# Patient Record
Sex: Male | Born: 1940 | Race: White | Hispanic: No | Smoking: Former smoker
Health system: Southern US, Academic
[De-identification: ages and names within clinical notes are randomized; demographics above are authoritative.]

## PROBLEM LIST (undated history)

## (undated) DIAGNOSIS — J918 Pleural effusion in other conditions classified elsewhere: Secondary | ICD-10-CM

## (undated) DIAGNOSIS — K769 Liver disease, unspecified: Secondary | ICD-10-CM

## (undated) DIAGNOSIS — Z125 Encounter for screening for malignant neoplasm of prostate: Secondary | ICD-10-CM

## (undated) HISTORY — DX: Pleural effusion in other conditions classified elsewhere: J91.8

## (undated) HISTORY — PX: HAND SURGERY: SHX662

## (undated) HISTORY — DX: Liver disease, unspecified: K76.9

## (undated) HISTORY — DX: Encounter for screening for malignant neoplasm of prostate: Z12.5

## (undated) NOTE — Telephone Encounter (Signed)
Formatting of this note might be different from the original.  Faxed over order and documents to scheduling at [facility]  [phone]   Electronically signed by Janyth Contes, RMA at 02/06/2023 10:06 AM EDT

---

## 1990-05-10 ENCOUNTER — Other Ambulatory Visit (HOSPITAL_COMMUNITY): Payer: Self-pay

## 2009-05-30 HISTORY — PX: HX BACK SURGERY: SHX140

## 2009-11-25 DIAGNOSIS — M5136 Other intervertebral disc degeneration, lumbar region: Secondary | ICD-10-CM | POA: Insufficient documentation

## 2009-12-23 DIAGNOSIS — R270 Ataxia, unspecified: Secondary | ICD-10-CM | POA: Insufficient documentation

## 2010-05-30 HISTORY — PX: COLONSCOPY BEDSIDE: WVUENDOPRO140

## 2011-06-16 DIAGNOSIS — M5126 Other intervertebral disc displacement, lumbar region: Secondary | ICD-10-CM | POA: Insufficient documentation

## 2017-02-21 IMAGING — CR XRAY SHOULDER COMPLETE RT
1 series · 2 of 2 positions shown · non-contrast
Comparison: None.

Exam:  XRAY SHOULDER COMPLETE RT 2V
INDICATION: Pain.

[Series 4: view not recorded · 0.17mm/px · 2 of 2 slices shown]
[im 1/2]
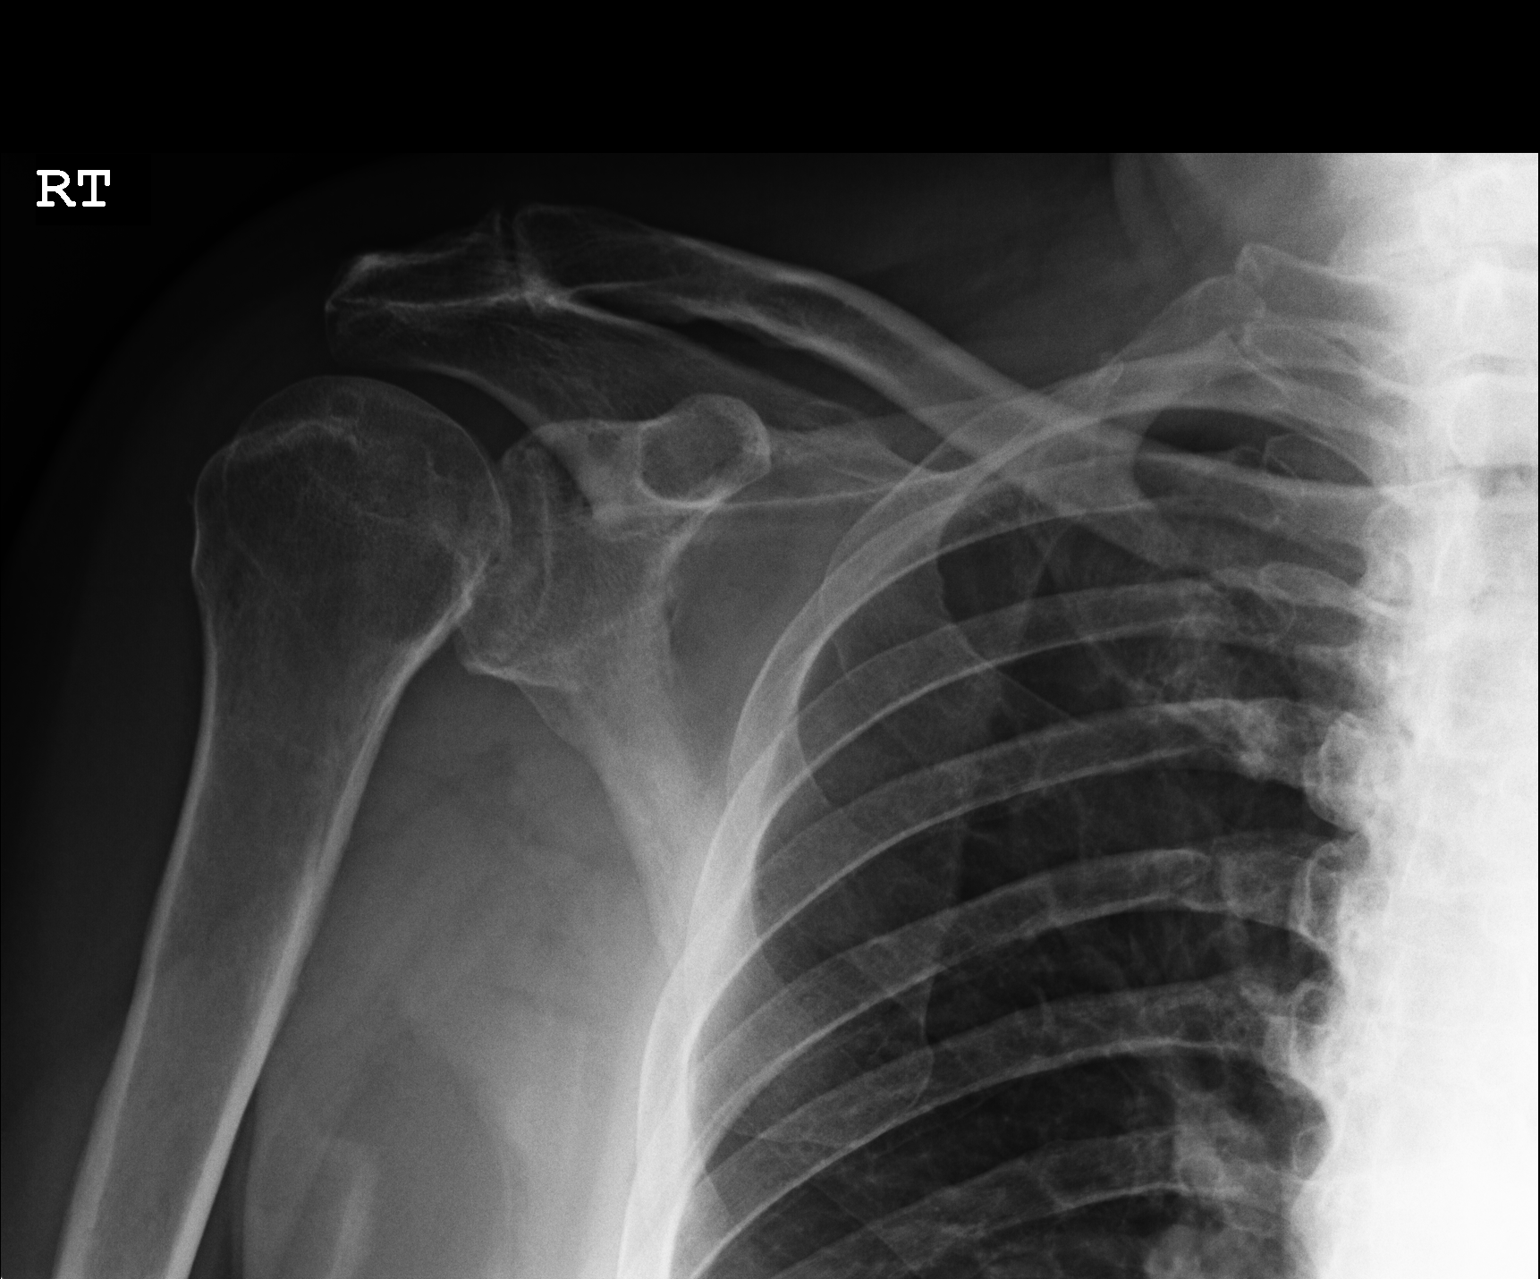
[im 2/2]
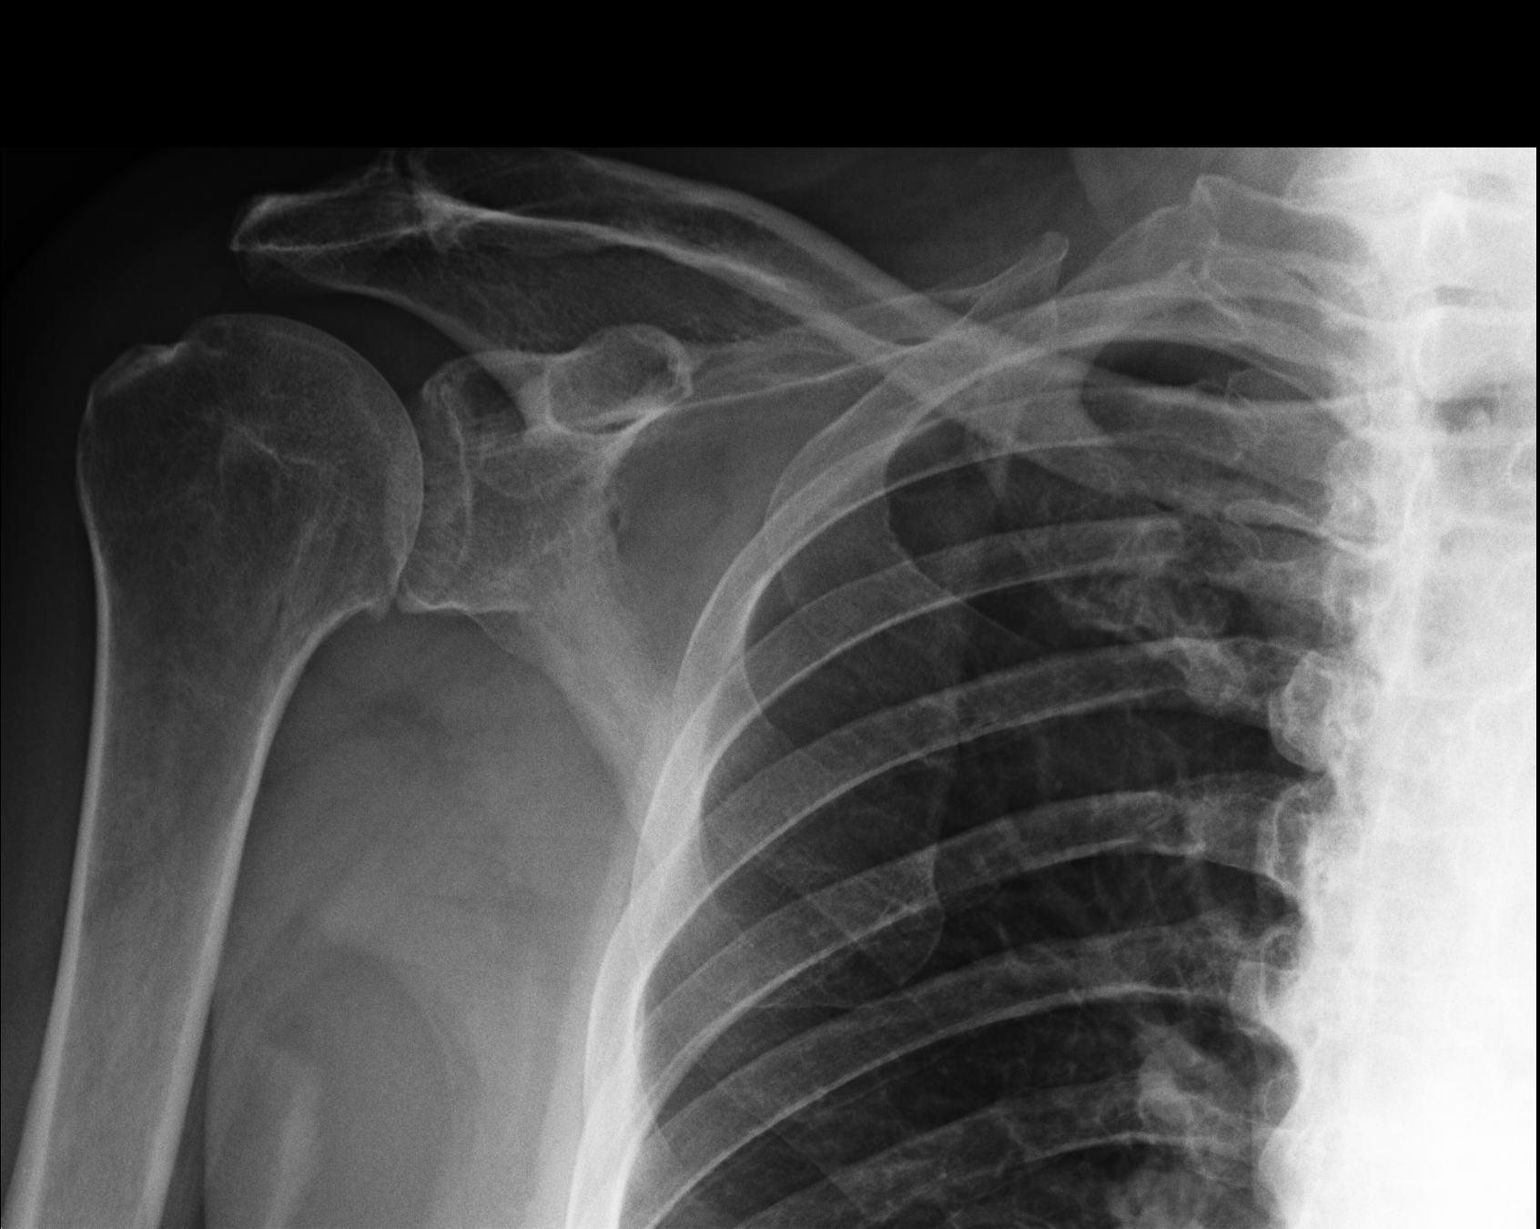

[2 of 2 positions shown; findings below may reference images not displayed]

FINDINGS: There is no acute fracture or subluxation. There is moderate osteoarthritis of the acromioclavicular and glenohumeral articulations. Visualized right lung is clear. Surrounding soft tissues are unremarkable.
IMPRESSION: Moderate osteoarthritis, no acute osseous abnormality.

## 2021-06-11 IMAGING — MR MRI SHOULDER LT W/O CONTRAST
6 of 7 series · 30 of 40 positions shown · IV contrast (gadolinium)
Comparison: None available.

﻿EXAM:  [id number]   MRI SHOULDER LT W/O CONTRAST
INDICATION: Pain for several months.
TECHNIQUE: Multiplanar multisequential MRI of the left shoulder joint was performed without gadolinium contrast.

[Series 6: T1 · oblique · left · 4.0mm · 0.31mm/px · 5 of 22 slices shown]
[im 1/22]
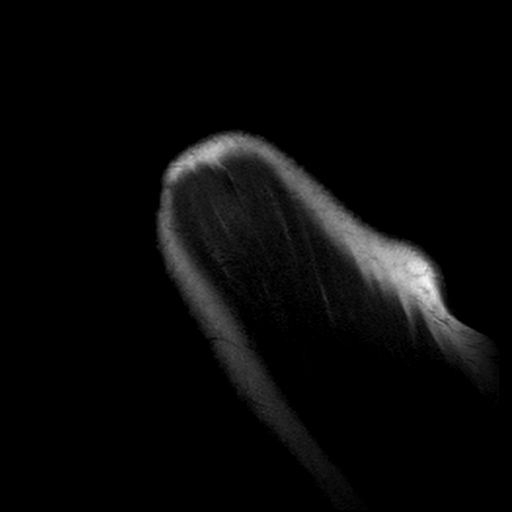
[im 6/22]
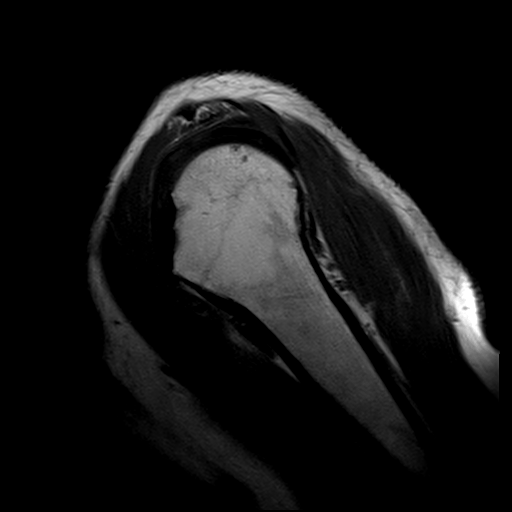
[im 11/22]
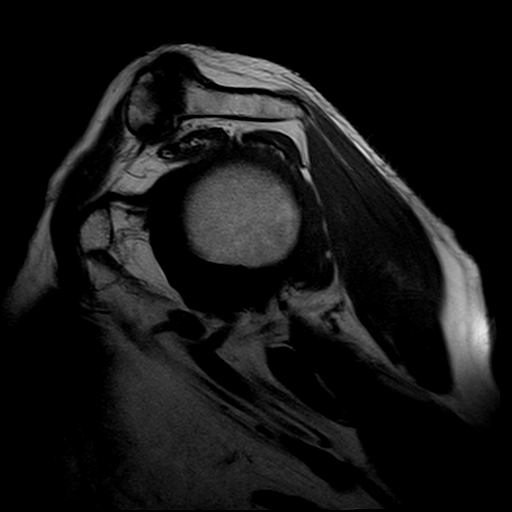
[im 16/22]
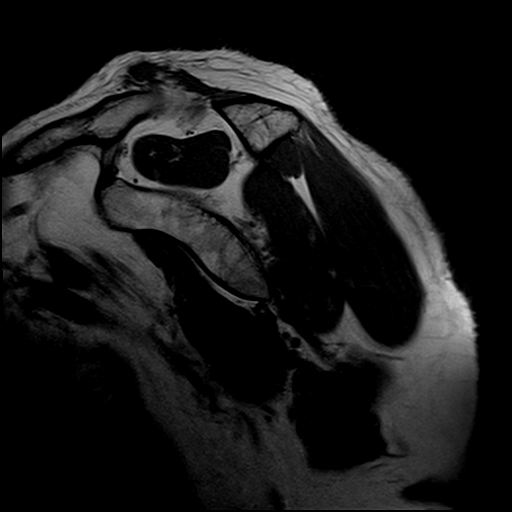
[im 22/22]
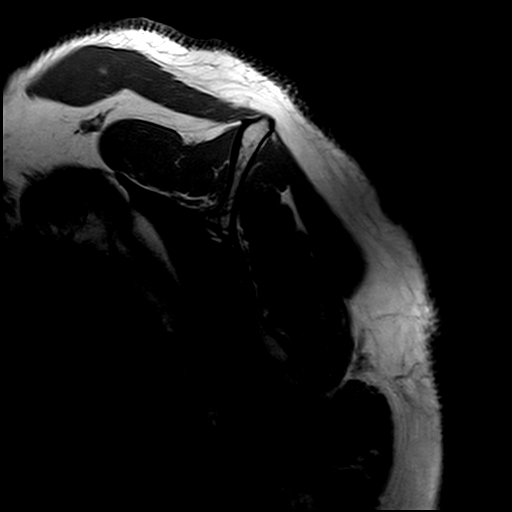

[Series 7: T2 · oblique · left · 4.0mm · 0.42mm/px · 5 of 22 slices shown]
[im 1/22]
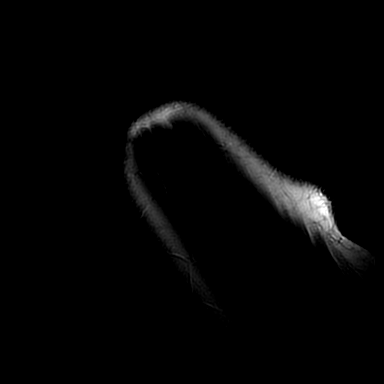
[im 6/22]
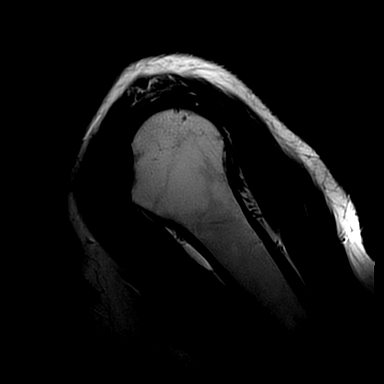
[im 11/22]
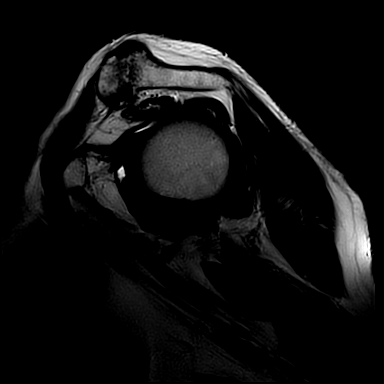
[im 16/22]
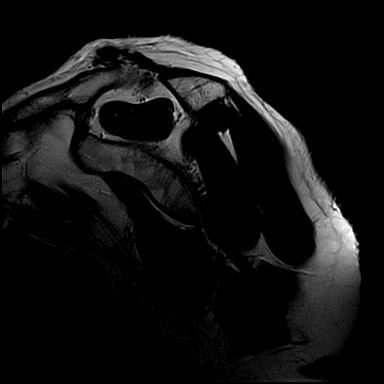
[im 22/22]
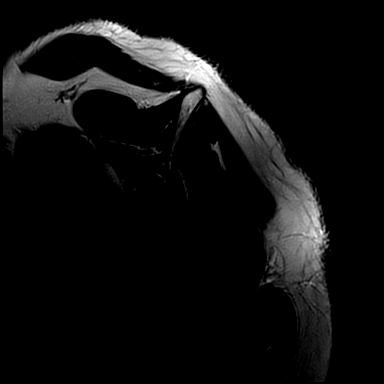

[Series 8: STIR · oblique · left · 4.0mm · 0.36mm/px · 2 of 22 slices shown]
[im 1/22]
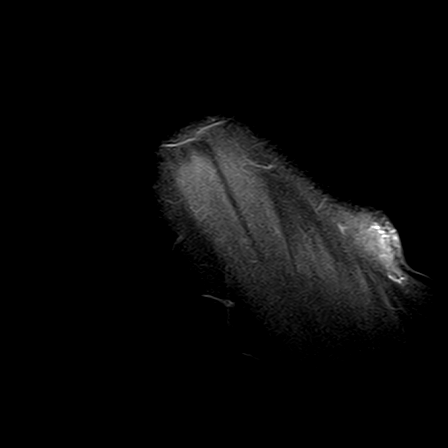
[im 5/22]
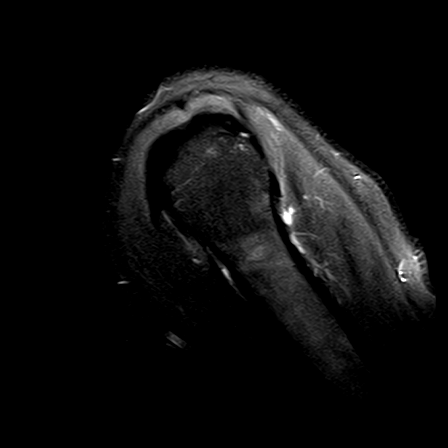

[Series 9: PD fat-sat · axial · left · 4.0mm · 0.36mm/px · z∈[-54,+55]mm · 6 of 24 slices shown (1 of 2)]
[im 1/24]
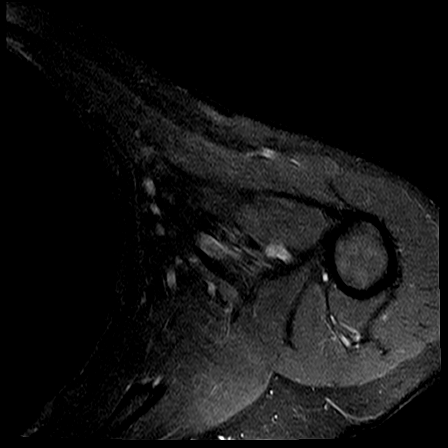
[im 5/24]
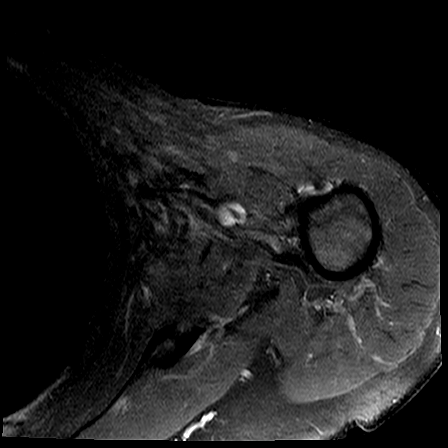
[im 10/24]
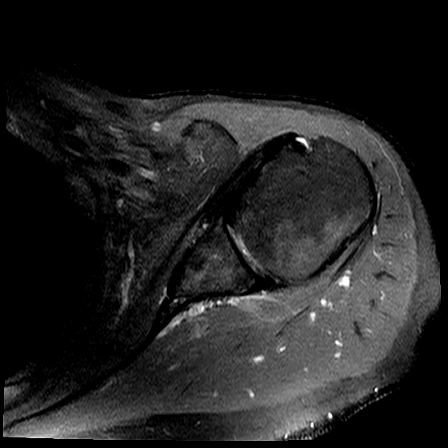
[im 14/24]
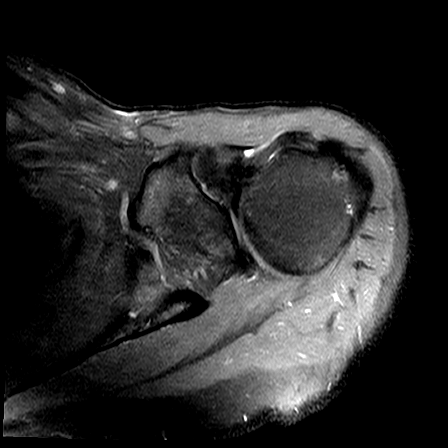
[im 19/24]
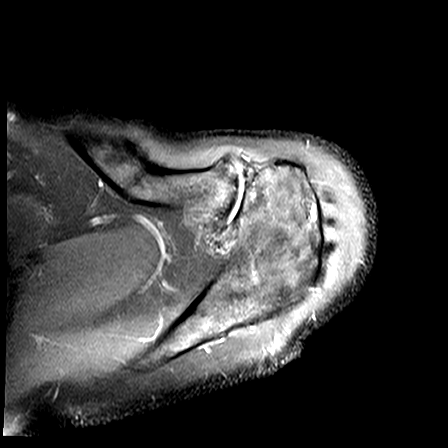
[im 24/24]
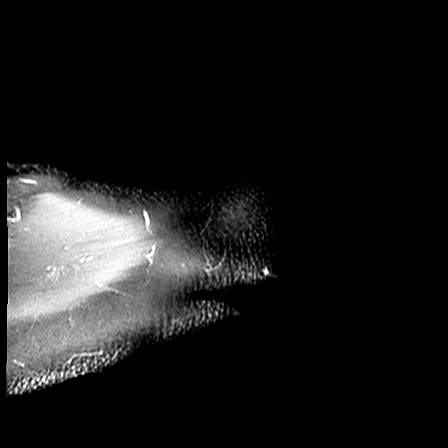

[Series 10: PD fat-sat · oblique · left · 4.0mm · 0.50mm/px · 6 of 23 slices shown (2 of 2)]
[im 1/23]
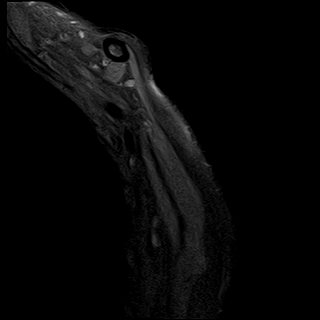
[im 5/23]
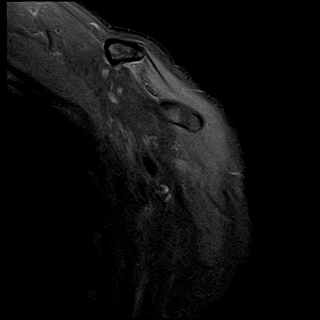
[im 9/23]
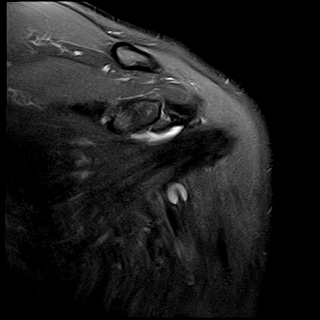
[im 14/23]
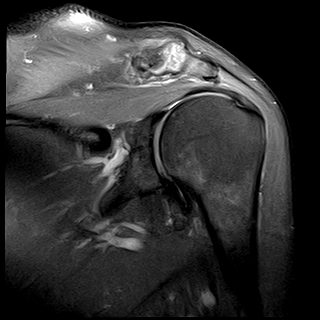
[im 18/23]
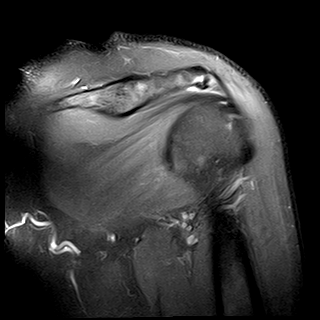
[im 23/23]
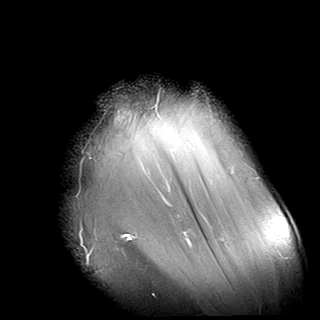

[Series 12: T2 fat-sat · axial · left · 4.0mm · 0.42mm/px · z∈[-54,+55]mm · 6 of 24 slices shown]
[im 1/24]
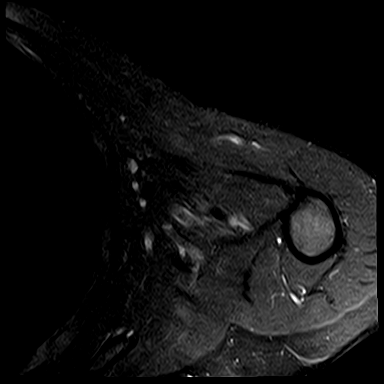
[im 5/24]
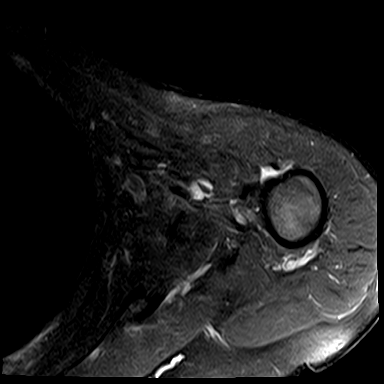
[im 10/24]
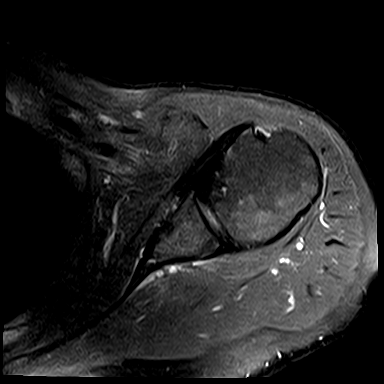
[im 14/24]
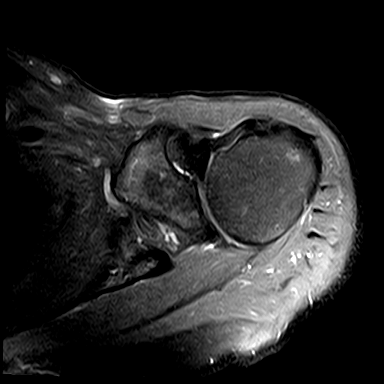
[im 19/24]
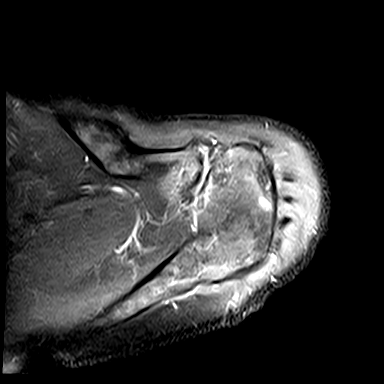
[im 24/24]
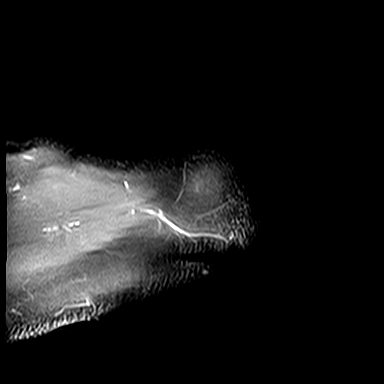

[30 of 40 positions shown; findings below may reference images not displayed]

FINDINGS: There is a partial undersurface infraspinatus tendon tear. Supraspinatus, teres minor and subscapularis muscles and tendons are within normal limits in morphology and signal intensity. Long head of biceps tendon is well seated within the intertubercular groove and attaches normally to the biceps anchor. Superior labrum is intact. Glenohumeral articular cartilage is well maintained. There is advanced acromioclavicular joint osteoarthritis. There is no significant fluid within the subacromial/subdeltoid bursa. Muscle bulk and bone marrow signal intensity are normal. No mass is seen along the course of the suprascapular nerve, within the spinoglenoid notch or within the quadrilateral space.
IMPRESSION: 1. Partial undersurface infraspinatus tendon tear. 

2. Advanced acromioclavicular joint osteoarthritis.

## 2021-07-12 IMAGING — CT CT THORAX  W/O CONTRAST
2 of 4 series · 15 of 36 positions shown, 18 images · non-contrast
Comparison: Chest radiograph dated 01/15/2018.

﻿EXAM:  CT THORAX  W/O CONTRAST
INDICATION: Shortness of breath for 3 months.
TECHNIQUE: Helical noncontrast CT imaging of the chest was performed. Images were reviewed in multiple windows and projections. Exam was performed using 1 or more of the following dose reduction techniques: Automated exposure control, adjustment of the mA and/or kV according to patient size, or the use of iterative reconstruction technique.

[cor · coronal · 0.86mm/px · 3 of 72 slices shown]
[im 15/72  lung]
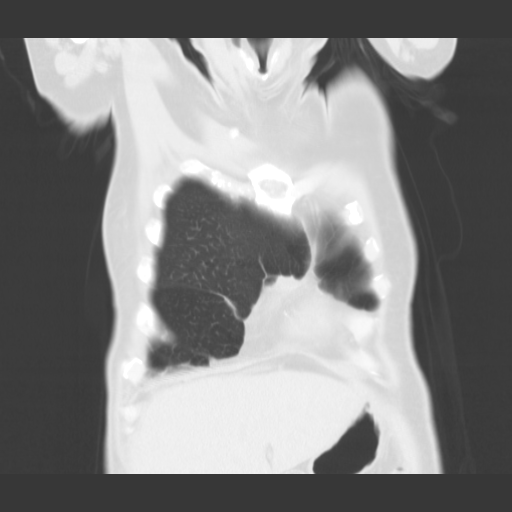
[im 29/72  lung]
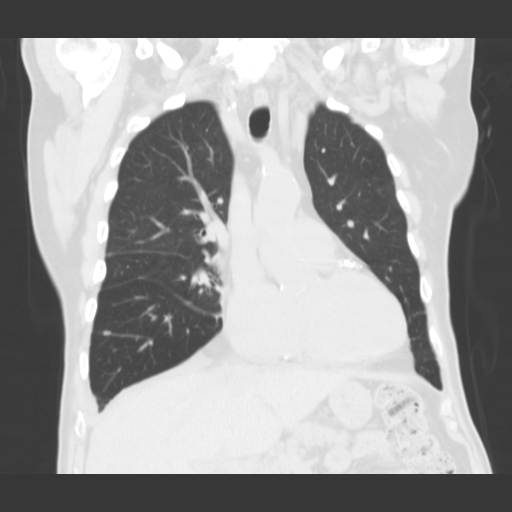
[im 43/72  lung]
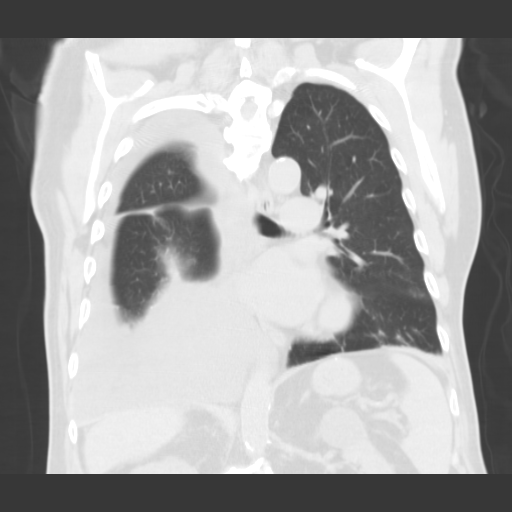

[lung · axial · 0.86mm/px · z∈[-267,+59]mm · 12 of 187 slices shown, 15 images]
[im 12/187  mediastinal]
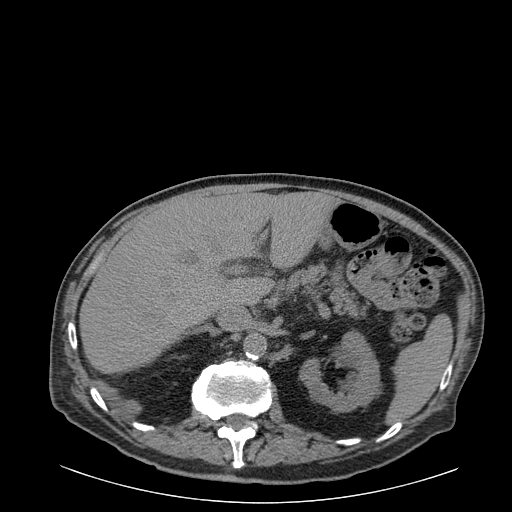
[im 12/187  lung]
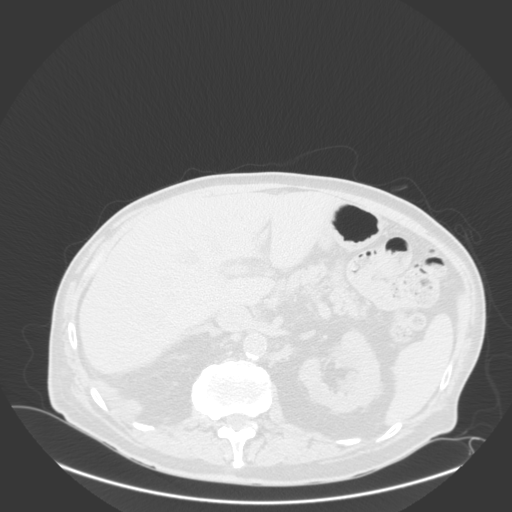
[im 24/187  lung]
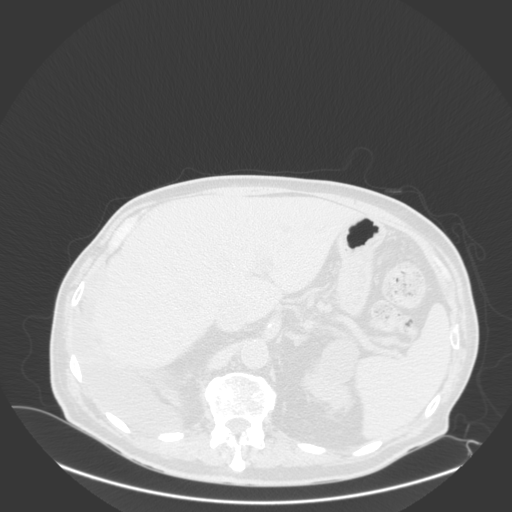
[im 47/187  lung]
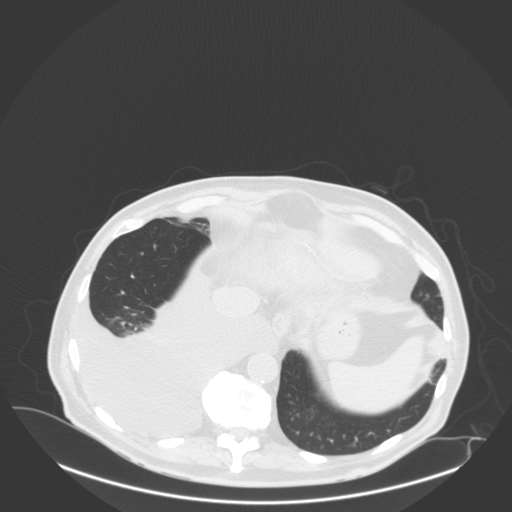
[im 59/187  lung]
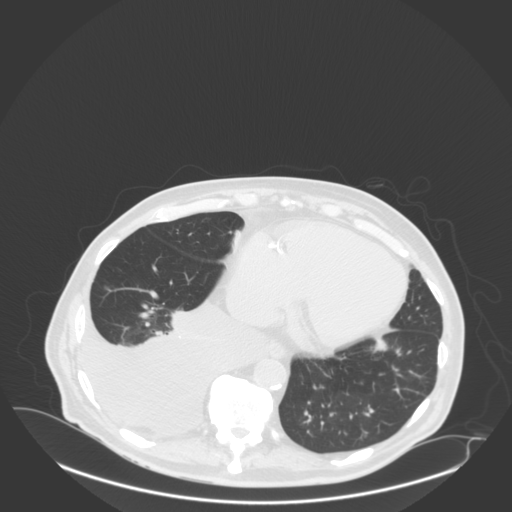
[im 70/187  mediastinal]
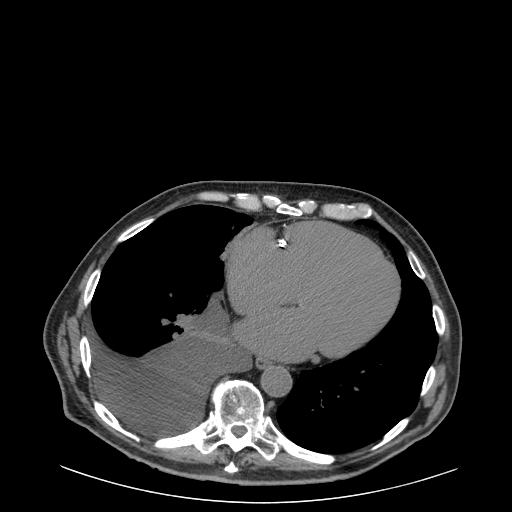
[im 70/187  lung]
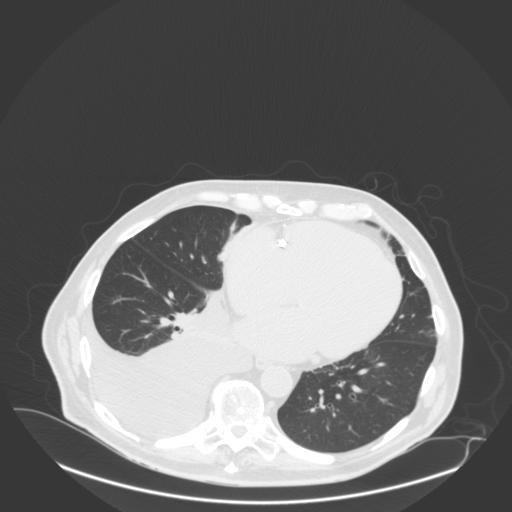
[im 82/187  lung]
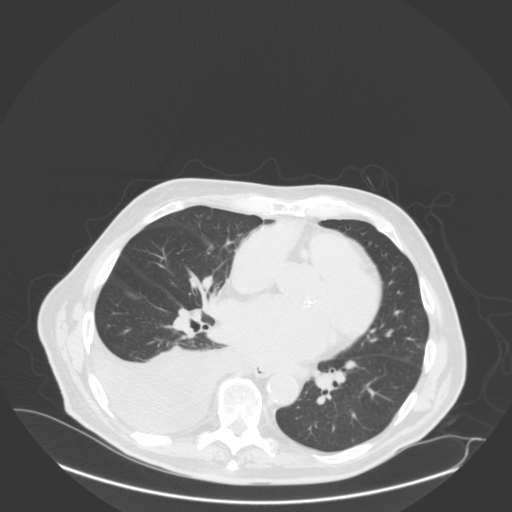
[im 105/187  lung]
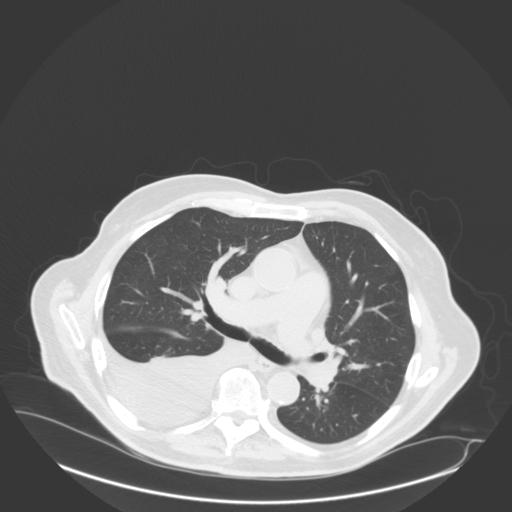
[im 117/187  lung]
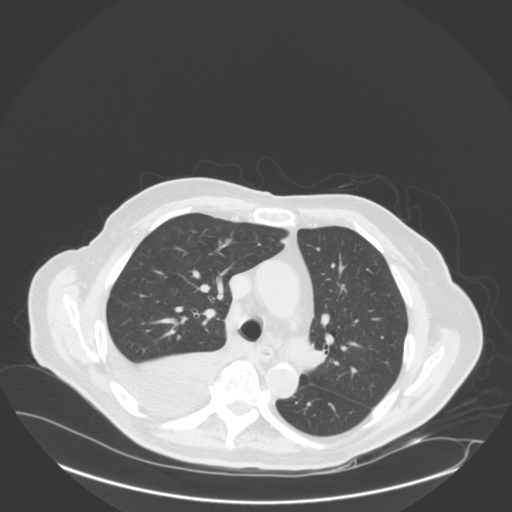
[im 128/187  mediastinal]
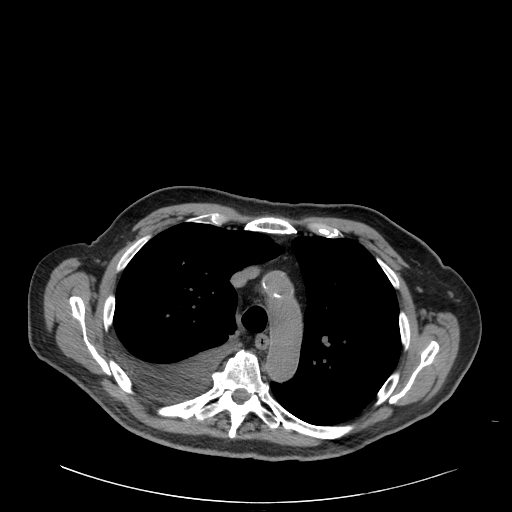
[im 128/187  lung]
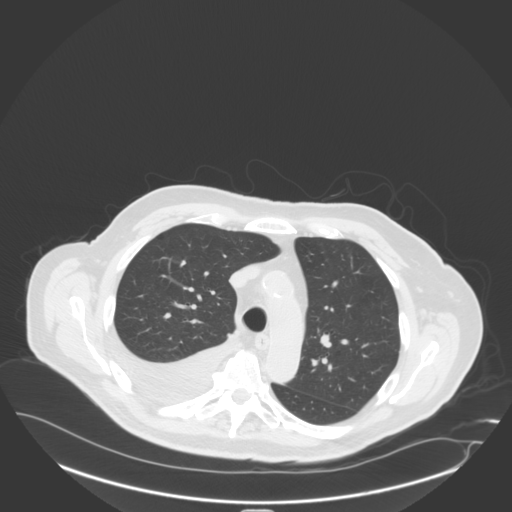
[im 140/187  lung]
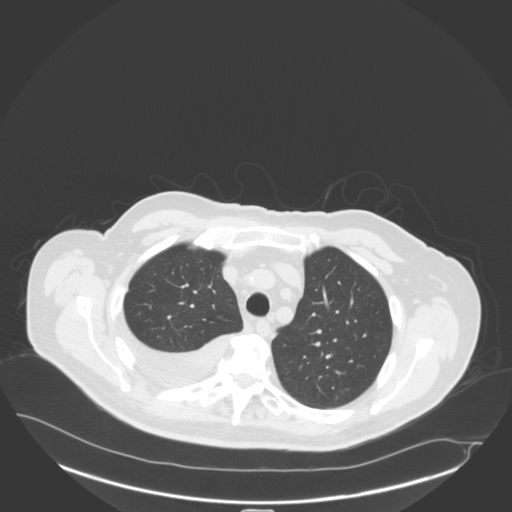
[im 163/187  lung]
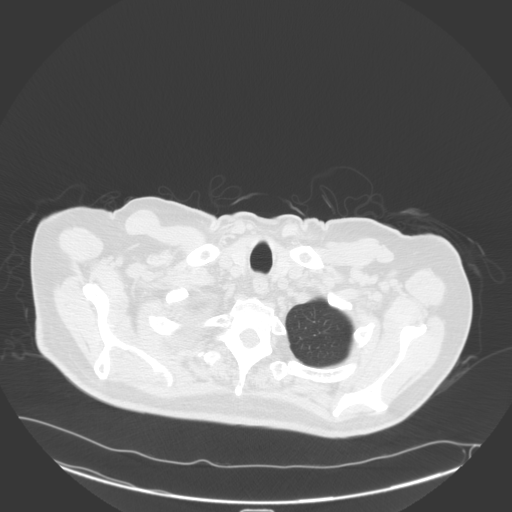
[im 175/187  lung]
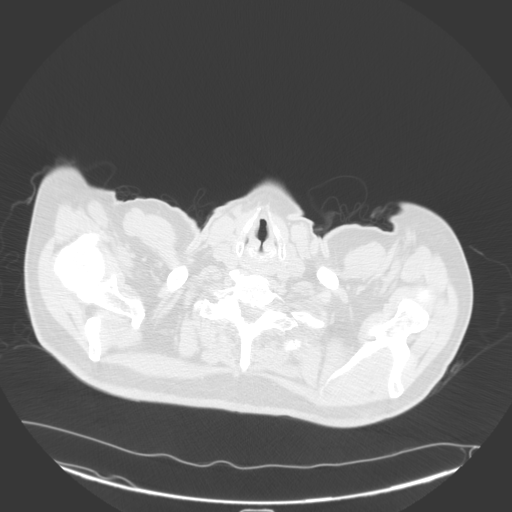

[15 of 36 positions shown; findings below may reference images not displayed]

FINDINGS: There is suggestion of a 12 mm inferior left thyroid lobe nodule. Trachea and mainstem bronchi are patent.  There is no mediastinal or axillary adenopathy. Evaluation for hilar adenopathy is limited due to the lack of intravenous contrast.  There is a moderate right pleural effusion. There are also a few noncalcified lung nodules measuring up to 7.3 mm within inferior right lower lobe.  There is no pericardial effusion.  There is no aortic aneurysm. Coronary arteries are heavily calcified.  Left renal cysts are incompletely seen.  Visualized osseous structures appear unremarkable.
IMPRESSION: 1. Moderate right pleural effusion. 

2. Subcentimeter noncalcified lung nodules as detailed above. Follow-up chest CT scan is recommended in 6 months for reassessment.  

3. Suggestion of a 12 mm inferior left thyroid lobe nodule. Follow-up nonemergent thyroid sonogram is also recommended for better assessment.

## 2021-08-02 ENCOUNTER — Other Ambulatory Visit (RURAL_HEALTH_CENTER): Payer: Self-pay | Admitting: Family

## 2021-08-02 DIAGNOSIS — E559 Vitamin D deficiency, unspecified: Secondary | ICD-10-CM

## 2021-08-04 ENCOUNTER — Encounter (RURAL_HEALTH_CENTER): Payer: Self-pay | Admitting: Family

## 2021-08-04 DIAGNOSIS — I509 Heart failure, unspecified: Secondary | ICD-10-CM | POA: Insufficient documentation

## 2021-08-04 DIAGNOSIS — I4891 Unspecified atrial fibrillation: Secondary | ICD-10-CM | POA: Insufficient documentation

## 2021-08-04 DIAGNOSIS — G4730 Sleep apnea, unspecified: Secondary | ICD-10-CM | POA: Insufficient documentation

## 2021-08-04 DIAGNOSIS — J90 Pleural effusion, not elsewhere classified: Secondary | ICD-10-CM | POA: Insufficient documentation

## 2021-08-04 DIAGNOSIS — K769 Liver disease, unspecified: Secondary | ICD-10-CM | POA: Insufficient documentation

## 2021-08-04 DIAGNOSIS — I350 Nonrheumatic aortic (valve) stenosis: Secondary | ICD-10-CM | POA: Insufficient documentation

## 2021-08-06 ENCOUNTER — Telehealth (RURAL_HEALTH_CENTER): Payer: Self-pay | Admitting: Family

## 2021-08-06 NOTE — Telephone Encounter (Signed)
Patient came in the office and needs his medications called into the pharmacy,  He needs the following prescriptions :  Ampicillian, Fluticassone-Furoate-Vilonterol 100-25  Please send these to [facility] Thank You  Cherlyn Labella 08/06/2021

## 2021-08-09 ENCOUNTER — Other Ambulatory Visit (RURAL_HEALTH_CENTER): Payer: Self-pay | Admitting: Family

## 2021-08-09 ENCOUNTER — Other Ambulatory Visit (HOSPITAL_COMMUNITY): Payer: Self-pay | Admitting: PULMONARY DISEASE

## 2021-08-09 DIAGNOSIS — J90 Pleural effusion, not elsewhere classified: Secondary | ICD-10-CM

## 2021-08-09 MED ORDER — FLUTICASONE FUROATE 100 MCG-VILANTEROL 25 MCG/DOSE INHALATION POWDER
1.0000 | DISK | Freq: Every day | RESPIRATORY_TRACT | 3 refills | Status: DC
Start: 2021-08-09 — End: 2021-08-16

## 2021-08-09 MED ORDER — AMPICILLIN 500 MG CAPSULE
500.0000 mg | ORAL_CAPSULE | Freq: Every day | ORAL | 1 refills | Status: DC
Start: 2021-08-09 — End: 2021-08-19

## 2021-08-16 ENCOUNTER — Other Ambulatory Visit (RURAL_HEALTH_CENTER): Payer: Self-pay | Admitting: Family

## 2021-08-16 MED ORDER — FLUTICASONE FUROATE 100 MCG-VILANTEROL 25 MCG/DOSE INHALATION POWDER
1.0000 | DISK | Freq: Every day | RESPIRATORY_TRACT | 3 refills | Status: DC
Start: 2021-08-16 — End: 2021-08-19

## 2021-08-18 ENCOUNTER — Inpatient Hospital Stay (HOSPITAL_BASED_OUTPATIENT_CLINIC_OR_DEPARTMENT_OTHER)
Admission: RE | Admit: 2021-08-18 | Discharge: 2021-08-18 | Disposition: A | Discharge status: Home / Self Care | Payer: Medicare Other | Source: Ambulatory Visit

## 2021-08-18 ENCOUNTER — Inpatient Hospital Stay
Admission: RE | Admit: 2021-08-18 | Discharge: 2021-08-18 | Disposition: A | Discharge status: Home / Self Care | Payer: Medicare Other | Source: Ambulatory Visit | Attending: PULMONARY DISEASE | Admitting: PULMONARY DISEASE

## 2021-08-18 ENCOUNTER — Other Ambulatory Visit: Payer: Self-pay

## 2021-08-18 DIAGNOSIS — J90 Pleural effusion, not elsewhere classified: Secondary | ICD-10-CM

## 2021-08-18 DIAGNOSIS — J69 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: Secondary | ICD-10-CM

## 2021-08-18 HISTORY — DX: 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: J69.0

## 2021-08-18 LAB — BODY FLUID CELL COUNT WITH DIFFERENTIAL
NUCLEATED CELLS, FLUID: 733 /uL
RBC COUNT: 0 /uL

## 2021-08-18 LAB — BODY FLUID SEROUS MAN DIFF
LYMPHOCYTE %: 71 %
NEUTROPHIL %: 8 %
OTHER CELL %: 21 %

## 2021-08-19 ENCOUNTER — Ambulatory Visit (RURAL_HEALTH_CENTER): Payer: Medicare Other | Attending: Family | Admitting: Family

## 2021-08-19 ENCOUNTER — Other Ambulatory Visit: Payer: Medicare Other | Attending: Family | Admitting: Family

## 2021-08-19 ENCOUNTER — Encounter (RURAL_HEALTH_CENTER): Payer: Self-pay | Admitting: Family

## 2021-08-19 ENCOUNTER — Ambulatory Visit (RURAL_HEALTH_CENTER): Payer: Self-pay | Admitting: Family

## 2021-08-19 VITALS — BP 122/86 | HR 64 | Temp 98.0°F | Resp 18 | Ht 70.0 in | Wt 174.0 lb

## 2021-08-19 DIAGNOSIS — L739 Follicular disorder, unspecified: Secondary | ICD-10-CM | POA: Insufficient documentation

## 2021-08-19 DIAGNOSIS — J4520 Mild intermittent asthma, uncomplicated: Secondary | ICD-10-CM | POA: Insufficient documentation

## 2021-08-19 DIAGNOSIS — E559 Vitamin D deficiency, unspecified: Secondary | ICD-10-CM | POA: Insufficient documentation

## 2021-08-19 DIAGNOSIS — Z7409 Other reduced mobility: Secondary | ICD-10-CM | POA: Insufficient documentation

## 2021-08-19 DIAGNOSIS — R269 Unspecified abnormalities of gait and mobility: Secondary | ICD-10-CM | POA: Insufficient documentation

## 2021-08-19 DIAGNOSIS — R7301 Impaired fasting glucose: Secondary | ICD-10-CM | POA: Insufficient documentation

## 2021-08-19 DIAGNOSIS — F419 Anxiety disorder, unspecified: Secondary | ICD-10-CM | POA: Insufficient documentation

## 2021-08-19 DIAGNOSIS — I10 Essential (primary) hypertension: Secondary | ICD-10-CM | POA: Insufficient documentation

## 2021-08-19 DIAGNOSIS — Z87891 Personal history of nicotine dependence: Secondary | ICD-10-CM | POA: Insufficient documentation

## 2021-08-19 DIAGNOSIS — E782 Mixed hyperlipidemia: Secondary | ICD-10-CM | POA: Insufficient documentation

## 2021-08-19 DIAGNOSIS — G4730 Sleep apnea, unspecified: Secondary | ICD-10-CM

## 2021-08-19 DIAGNOSIS — J449 Chronic obstructive pulmonary disease, unspecified: Secondary | ICD-10-CM | POA: Insufficient documentation

## 2021-08-19 HISTORY — DX: Follicular disorder, unspecified: L73.9

## 2021-08-19 LAB — MANUAL DIFFERENTIAL
EOSINOPHIL %: 3 %
EOSINOPHIL ABSOLUTE: 0.16 10*3/uL
EOSINOPHILS MANUAL: 3
LYMPHOCYTE %: 18 %
LYMPHOCYTE ABSOLUTE: 0.95 10*3/uL — ABNORMAL LOW (ref 1.10–5.00)
LYMPHOCYTES MANUAL: 18
MONOCYTE %: 9 %
MONOCYTE ABSOLUTE: 0.48 10*3/uL
MONOCYTES MANUAL: 9
NEUTROPHIL %: 70 %
NEUTROPHIL ABSOLUTE: 3.71 10*3/uL (ref 1.80–8.40)
NEUTROPHILS MANUAL: 70
PLATELET MORPHOLOGY COMMENT: DECREASED
RBC MORPHOLOGY COMMENT: NORMAL
TOTAL CELLS COUNTED [#] IN BLOOD: 100
WBC: 5.3 10*3/uL

## 2021-08-19 LAB — CBC
HCT: 42.4 % (ref 42.0–51.0)
HGB: 14.8 g/dL (ref 13.5–18.0)
MCH: 34 pg — ABNORMAL HIGH (ref 27.0–32.0)
MCHC: 34.9 g/dL (ref 32.0–36.0)
MCV: 97.5 fL (ref 78.0–99.0)
MPV: 8.2 fL (ref 7.4–10.4)
PLATELETS: 138 10*3/uL — ABNORMAL LOW (ref 140–440)
RBC: 4.35 10*6/uL (ref 4.20–6.00)
RDW: 13 % (ref 11.6–14.8)
WBC: 5.3 10*3/uL (ref 4.0–10.5)
WBCS UNCORRECTED: 5.6 10*3/uL

## 2021-08-19 LAB — CYTOPATHOLOGY, NON GYN

## 2021-08-19 LAB — COMPREHENSIVE METABOLIC PNL, FASTING
ALBUMIN/GLOBULIN RATIO: 1.4 (ref 0.8–1.4)
ALBUMIN: 4.6 g/dL (ref 3.5–5.7)
ALKALINE PHOSPHATASE: 81 U/L (ref 34–104)
ALT (SGPT): 24 U/L (ref 7–52)
ANION GAP: 5 mmol/L — ABNORMAL LOW (ref 10–20)
AST (SGOT): 33 U/L (ref 13–39)
BILIRUBIN TOTAL: 1.5 mg/dL — ABNORMAL HIGH (ref 0.3–1.2)
BUN/CREA RATIO: 24 — ABNORMAL HIGH (ref 6–22)
BUN: 20 mg/dL (ref 7–25)
CALCIUM, CORRECTED: 9.3 mg/dL (ref 8.9–10.8)
CALCIUM: 9.9 mg/dL (ref 8.6–10.3)
CHLORIDE: 107 mmol/L (ref 98–107)
CO2 TOTAL: 30 mmol/L (ref 21–31)
CREATININE: 0.82 mg/dL (ref 0.60–1.30)
ESTIMATED GFR: 89 mL/min/{1.73_m2} (ref >59)
GLOBULIN: 3.2 (ref 2.9–5.4)
GLUCOSE: 108 mg/dL (ref 74–109)
OSMOLALITY, CALCULATED: 286 mOsm/kg (ref 270–290)
POTASSIUM: 4.5 mmol/L (ref 3.5–5.1)
PROTEIN TOTAL: 7.8 g/dL (ref 6.4–8.9)
SODIUM: 142 mmol/L (ref 136–145)

## 2021-08-19 LAB — VITAMIN D 25 TOTAL: VITAMIN D: 68 ng/mL (ref 30–100)

## 2021-08-19 LAB — LIPID PANEL
CHOL/HDL RATIO: 2.1
CHOLESTEROL: 133 mg/dL — ABNORMAL LOW (ref 136–290)
HDL CHOL: 63 mg/dL (ref 23–92)
LDL CALC: 61 mg/dL (ref 0–100)
TRIGLYCERIDES: 44 mg/dL (ref <=150)
VLDL CALC: 9 mg/dL (ref 0–50)

## 2021-08-19 LAB — ALBUMIN, BODY FLUID: ALBUMIN BODY FLUID: 2.7 g/dL

## 2021-08-19 LAB — MAGNESIUM: MAGNESIUM: 2.3 mg/dL (ref 1.9–2.7)

## 2021-08-19 LAB — VITAMIN B12: VITAMIN B 12: 709 pg/mL (ref 180–914)

## 2021-08-19 LAB — GLUCOSE BODY FLUID: GLUCOSE BODY FLUID: 113 mg/dL (ref 65–125)

## 2021-08-19 LAB — LDH, BODY FLUID: LDH BODY FLUID: 82 U/L

## 2021-08-19 MED ORDER — CHOLECALCIFEROL (VITAMIN D3) 1,250 MCG (50,000 UNIT) CAPSULE
50000.0000 [IU] | ORAL_CAPSULE | ORAL | 1 refills | Status: DC
Start: 2021-08-19 — End: 2022-03-30

## 2021-08-19 MED ORDER — LOSARTAN 100 MG-HYDROCHLOROTHIAZIDE 25 MG TABLET
1.0000 | ORAL_TABLET | Freq: Every day | ORAL | 1 refills | Status: DC
Start: 2021-08-19 — End: 2022-03-30

## 2021-08-19 MED ORDER — FLUTICASONE FUROATE 100 MCG-VILANTEROL 25 MCG/DOSE INHALATION POWDER
1.0000 | DISK | Freq: Every day | RESPIRATORY_TRACT | 3 refills | Status: AC
Start: 2021-08-19 — End: 2021-11-17

## 2021-08-19 MED ORDER — ROSUVASTATIN 20 MG TABLET
20.0000 mg | ORAL_TABLET | Freq: Every day | ORAL | 1 refills | Status: DC
Start: 2021-08-19 — End: 2022-06-30

## 2021-08-19 MED ORDER — ALBUTEROL SULFATE HFA 90 MCG/ACTUATION AEROSOL INHALER
2.0000 | INHALATION_SPRAY | Freq: Four times a day (QID) | RESPIRATORY_TRACT | 1 refills | Status: AC | PRN
Start: 2021-08-19 — End: ?

## 2021-08-19 MED ORDER — AMPICILLIN 500 MG CAPSULE
500.0000 mg | ORAL_CAPSULE | Freq: Every day | ORAL | 1 refills | Status: DC
Start: 2021-08-19 — End: 2022-04-01

## 2021-08-19 NOTE — Nursing Note (Signed)
Patient is here for 3 month follow up with medication refills and no new concerns at this time.

## 2021-08-19 NOTE — Progress Notes (Signed)
FAMILY MEDICINE, [facility]  [address]  [place] [address]  Operated by [facility]     Name: Jesse Cooper. MRN:  [id number]   Date of Birth: 1940-12-06 Age: 81 y.o.   Date: 08/19/2021  Time: 10:58     Provider: Stormy Fabian, NP-C    Reason for visit: Follow Up 3 Months      History of Present Illness:  Jesse Cooper. is a 81 y.o. male presenting with chronic disease management..  Managed by cardiology and pulmonology.    Patient Active Problem List    Diagnosis Date Noted   . Essential hypertension 08/19/2021   . Mixed hyperlipidemia 08/19/2021   . Anxiety 08/19/2021   . 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 Mild intermittent asthma 08/19/2021   . Hypomagnesemia 08/19/2021   . IFG (impaired fasting glucose) 08/19/2021   . Impaired functional mobility, balance, gait, and endurance 08/19/2021   . Vitamin D deficiency, unspecified 08/19/2021   . Sleep apnea 08/04/2021     Dr. Felton Clinton, Pulmonologist. Compliant with CPAP.     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 Felton Clinton, Pulmonologist.     . Unspecified atrial fibrillation (CMS HCC) 08/04/2021   . 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 Congestive heart failure (CHF) (CMS HCC) 08/04/2021       Historical Data    Past Medical History:  History reviewed. No pertinent past medical history.  Past Surgical History:  Past Surgical History:   Procedure Laterality Date   . COLONSCOPY BEDSIDE N/A 2012    [place] - no polyps   . HAND SURGERY Left     [facility] Kill[facility]   . [facility] BACK SURGERY N/A 2011    2013 [place], [place]     Allergies:  Allergies   Allergen Reactions   . Hydrocodone-Homatropine Itching     Medications:  Current Outpatient Medications   Medication Sig   . albuterol sulfate (PROVENTIL OR VENTOLIN OR PROAIR) 90 mcg/actuation Inhalation oral inhaler Take 2 Puffs by inhalation Every 6 hours as needed    . ampicillin (PRINCIPEN) 500 mg Oral Capsule Take 1 Capsule (500 mg total) by mouth Once a day for 90 days Indications: folliculitis   . carvediloL (COREG) 25 mg Oral Tablet Take 1 Tablet (25 mg total) by mouth Twice daily   . cholecalciferol, vitamin D3, 1,250 mcg (50,000 unit) Oral Capsule Take 1 Capsule (50,000 Units total) by mouth Every 7 days   . diltiazem HCl (DILT-XR) 240 mg Oral Capsule,Degradable Cnt Release Take 1 Capsule (240 mg total) by mouth Once a day   . ELIQUIS 5 mg Oral Tablet Take 1 Tablet (5 mg total) by mouth Twice daily   . fluticasone furoate-vilanteroL (BREO ELLIPTA) 100-25 mcg/dose Inhalation Disk with Device Take 1 INHALATION by inhalation Once a day for 90 days   . losartan-hydrochlorothiazide (HYZAAR) 100-25 mg Oral Tablet Take 1 Tablet by mouth Once a day   . Magnesium 250 mg Oral Tablet Take 1 Tablet (250 mg total) by mouth Once a day   . multivitamin (SUPER THERA VITE M) Oral Tablet Take 1 Tablet by mouth   . rosuvastatin (CRESTOR) 20 mg Oral Tablet Take 1 Tablet (20 mg total) by mouth Once a day   . traMADoL (ULTRAM) 50 mg Oral Tablet Take 1 Tablet (50 mg  total) by mouth Every 8 hours as needed     Family History:  Family Medical History:     Problem Relation (Age of Onset)    Dementia Mother    Elevated Lipids Brother    Lung Cancer Father          Social History:  Social History     Socioeconomic History   . Marital status: Widowed   . Number of children: 3   Tobacco Use   . Smoking status: Former     Packs/day: 1.00     Years: 12.00     Pack years: 12.00     Types: Cigarettes     Start date: 1962     Quit date: 1975     Years since quitting: 48.2   . Smokeless tobacco: Never   . Tobacco comments:     Quit chewing tobacco in 1988   Substance and Sexual Activity   . Alcohol use: Not Currently     Alcohol/week: 42.0 standard drinks     Types: 7 Cans of beer, 14 Shots of liquor, 21 Standard drinks or equivalent per week   . Drug use: Never           Review of Systems:  Any pertinent  Review of Systems as addressed in the HPI above.    Physical Exam:  Vital Signs:  Vitals:    08/19/21 1020   BP: 122/86   Pulse: 64   Resp: 18   Temp: 36.7 C (98 F)   SpO2: 97%   Weight: 78.9 kg (174 lb)   Height: 1.778 m (5\' 10" )   BMI: 25.02     Physical Exam  Constitutional:       Appearance: Normal appearance. He is normal weight.   HENT:      Head: Normocephalic.      Right Ear: Tympanic membrane normal.      Left Ear: Tympanic membrane normal.      Nose: Nose normal.      Mouth/Throat:      Mouth: Mucous membranes are moist.   Eyes:      Extraocular Movements: Extraocular movements intact.      Conjunctiva/sclera: Conjunctivae normal.   Cardiovascular:      Rate and Rhythm: Normal rate. Rhythm irregular.      Pulses: Normal pulses.      Heart sounds: Normal heart sounds, S1 normal and S2 normal.   Pulmonary:      Effort: Pulmonary effort is normal.      Breath sounds: Normal breath sounds.   Abdominal:      General: Bowel sounds are normal.      Palpations: Abdomen is soft.   Musculoskeletal:         General: Normal range of motion.      Cervical back: Normal range of motion and neck supple.      Right lower leg: No edema.      Left lower leg: No edema.   Skin:     General: Skin is warm and dry.   Neurological:      General: No focal deficit present.      Mental Status: He is alert and oriented to person, place, and time. Mental status is at baseline.   Psychiatric:         Mood and Affect: Mood normal.         Behavior: Behavior normal.         Thought Content: Thought content normal.  Judgment: Judgment normal.          Assessment/Plan:  (I10) Essential hypertension  (primary encounter diagnosis)  Plan: CBC, COMPREHENSIVE METABOLIC PNL, FASTING,         LIPID PANEL, MAGNESIUM, VITAMIN D 25 TOTAL,         VITAMIN B12, MAGNESIUM    (E78.2) Mixed hyperlipidemia  Plan: CBC, COMPREHENSIVE METABOLIC PNL, FASTING,         LIPID PANEL, MAGNESIUM, VITAMIN D 25 TOTAL,         VITAMIN B12, MAGNESIUM    (F41.9)  Anxiety  Plan: CBC, COMPREHENSIVE METABOLIC PNL, FASTING,         LIPID PANEL, MAGNESIUM, VITAMIN D 25 TOTAL,         VITAMIN B12, MAGNESIUM    (L73.9) Chronic folliculitis  Plan: CBC, COMPREHENSIVE METABOLIC PNL, FASTING,         LIPID PANEL, MAGNESIUM, VITAMIN D 25 TOTAL,         VITAMIN B12, MAGNESIUM    (G47.30) Sleep apnea, unspecified type  Plan: CBC, COMPREHENSIVE METABOLIC PNL, FASTING,         LIPID PANEL, MAGNESIUM, VITAMIN D 25 TOTAL,         VITAMIN B12, MAGNESIUM    (J45.20) Mild intermittent asthma, unspecified whether complicated  Plan: CBC, COMPREHENSIVE METABOLIC PNL, FASTING,         LIPID PANEL, MAGNESIUM, VITAMIN D 25 TOTAL,         VITAMIN B12, MAGNESIUM    (E83.42) Hypomagnesemia  Plan: CBC, COMPREHENSIVE METABOLIC PNL, FASTING,         LIPID PANEL, MAGNESIUM, VITAMIN D 25 TOTAL,         VITAMIN B12, MAGNESIUM    (R73.01) IFG (impaired fasting glucose)  Plan: CBC, COMPREHENSIVE METABOLIC PNL, FASTING,         LIPID PANEL, MAGNESIUM, VITAMIN D 25 TOTAL,         VITAMIN B12, MAGNESIUM    (Z74.09) Impaired functional mobility, balance, gait, and endurance  Plan: CBC, COMPREHENSIVE METABOLIC PNL, FASTING,         LIPID PANEL, MAGNESIUM, VITAMIN D 25 TOTAL,         VITAMIN B12, MAGNESIUM    (E55.9) Vitamin D deficiency, unspecified  Plan: cholecalciferol, vitamin D3, 1,250 mcg (50,000         unit) Oral Capsule, CBC, COMPREHENSIVE         METABOLIC PNL, FASTING, LIPID PANEL, MAGNESIUM,        VITAMIN D 25 TOTAL, VITAMIN B12, MAGNESIUM       Labs drawn today.  Advise to follow up with specialist as scheduled. Continue current medications.  Advised a low-fat/low sodium diet, advised 150 minutes of scheduled weekly physical activity as tolerated.  Advised to maintain a healthy weight.      Return in about 6 months (around 02/19/2022) for routine visit and medicare wellness visit.    Stormy Fabianathy L Richard Ritchey, NP-C     Portions of this note may be dictated using voice recognition software or a dictation service.  Variances in spelling and vocabulary are possible and unintentional. Not all errors are caught/corrected. Please notify the [facility] if any discrepancies are noted or if the meaning of any statement is not clear.

## 2021-08-27 ENCOUNTER — Other Ambulatory Visit: Payer: Self-pay

## 2022-02-21 ENCOUNTER — Ambulatory Visit (RURAL_HEALTH_CENTER): Payer: Self-pay | Admitting: Family

## 2022-03-28 ENCOUNTER — Ambulatory Visit (RURAL_HEALTH_CENTER): Payer: Self-pay | Admitting: Family

## 2022-03-30 ENCOUNTER — Other Ambulatory Visit (RURAL_HEALTH_CENTER): Payer: Self-pay | Admitting: Family

## 2022-03-30 DIAGNOSIS — E559 Vitamin D deficiency, unspecified: Secondary | ICD-10-CM

## 2022-03-31 ENCOUNTER — Other Ambulatory Visit (RURAL_HEALTH_CENTER): Payer: Self-pay | Admitting: Family

## 2022-04-01 ENCOUNTER — Other Ambulatory Visit (RURAL_HEALTH_CENTER): Payer: Self-pay | Admitting: Family

## 2022-04-01 MED ORDER — AMPICILLIN 500 MG CAPSULE
500.0000 mg | ORAL_CAPSULE | Freq: Four times a day (QID) | ORAL | 1 refills | Status: DC
Start: 2022-04-01 — End: 2022-04-08

## 2022-04-08 ENCOUNTER — Ambulatory Visit (RURAL_HEALTH_CENTER): Payer: Medicare Other | Attending: Family | Admitting: Family

## 2022-04-08 ENCOUNTER — Ambulatory Visit: Payer: Medicare Other | Attending: Family | Admitting: Family

## 2022-04-08 ENCOUNTER — Encounter (RURAL_HEALTH_CENTER): Payer: Self-pay | Admitting: Family

## 2022-04-08 ENCOUNTER — Other Ambulatory Visit: Payer: Self-pay

## 2022-04-08 VITALS — BP 125/72 | HR 67 | Temp 98.2°F | Resp 18 | Ht 70.5 in | Wt 176.1 lb

## 2022-04-08 DIAGNOSIS — G4730 Sleep apnea, unspecified: Secondary | ICD-10-CM | POA: Insufficient documentation

## 2022-04-08 DIAGNOSIS — E782 Mixed hyperlipidemia: Secondary | ICD-10-CM | POA: Insufficient documentation

## 2022-04-08 DIAGNOSIS — E559 Vitamin D deficiency, unspecified: Secondary | ICD-10-CM | POA: Insufficient documentation

## 2022-04-08 DIAGNOSIS — L739 Follicular disorder, unspecified: Secondary | ICD-10-CM | POA: Insufficient documentation

## 2022-04-08 DIAGNOSIS — Z125 Encounter for screening for malignant neoplasm of prostate: Secondary | ICD-10-CM | POA: Insufficient documentation

## 2022-04-08 DIAGNOSIS — R7301 Impaired fasting glucose: Secondary | ICD-10-CM | POA: Insufficient documentation

## 2022-04-08 DIAGNOSIS — Z7901 Long term (current) use of anticoagulants: Secondary | ICD-10-CM | POA: Insufficient documentation

## 2022-04-08 DIAGNOSIS — Z7409 Other reduced mobility: Secondary | ICD-10-CM | POA: Insufficient documentation

## 2022-04-08 DIAGNOSIS — M1812 Unilateral primary osteoarthritis of first carpometacarpal joint, left hand: Secondary | ICD-10-CM | POA: Insufficient documentation

## 2022-04-08 DIAGNOSIS — F419 Anxiety disorder, unspecified: Secondary | ICD-10-CM

## 2022-04-08 DIAGNOSIS — I10 Essential (primary) hypertension: Secondary | ICD-10-CM | POA: Insufficient documentation

## 2022-04-08 DIAGNOSIS — J4520 Mild intermittent asthma, uncomplicated: Secondary | ICD-10-CM | POA: Insufficient documentation

## 2022-04-08 DIAGNOSIS — I4891 Unspecified atrial fibrillation: Secondary | ICD-10-CM | POA: Insufficient documentation

## 2022-04-08 HISTORY — DX: Encounter for screening for malignant neoplasm of prostate: Z12.5

## 2022-04-08 LAB — LIPID PANEL
CHOL/HDL RATIO: 1.9
CHOLESTEROL: 151 mg/dL (ref <200)
HDL CHOL: 81 mg/dL (ref 23–92)
LDL CALC: 59 mg/dL (ref 0–100)
TRIGLYCERIDES: 53 mg/dL (ref <=150)
VLDL CALC: 11 mg/dL (ref 0–50)

## 2022-04-08 LAB — COMPREHENSIVE METABOLIC PNL, FASTING
ALBUMIN/GLOBULIN RATIO: 1.4 (ref 0.8–1.4)
ALBUMIN: 4.9 g/dL (ref 3.5–5.7)
ALKALINE PHOSPHATASE: 71 U/L (ref 34–104)
ALT (SGPT): 27 U/L (ref 7–52)
ANION GAP: 9 mmol/L (ref 4–13)
AST (SGOT): 30 U/L (ref 13–39)
BILIRUBIN TOTAL: 1 mg/dL (ref 0.3–1.2)
BUN/CREA RATIO: 32 — ABNORMAL HIGH (ref 6–22)
BUN: 34 mg/dL — ABNORMAL HIGH (ref 7–25)
CALCIUM, CORRECTED: 9.3 mg/dL (ref 8.9–10.8)
CALCIUM: 10 mg/dL (ref 8.6–10.3)
CHLORIDE: 104 mmol/L (ref 98–107)
CO2 TOTAL: 28 mmol/L (ref 21–31)
CREATININE: 1.05 mg/dL (ref 0.60–1.30)
ESTIMATED GFR: 72 mL/min/{1.73_m2} (ref >59)
GLOBULIN: 3.5 (ref 2.9–5.4)
GLUCOSE: 105 mg/dL (ref 74–109)
OSMOLALITY, CALCULATED: 289 mOsm/kg (ref 270–290)
POTASSIUM: 4.3 mmol/L (ref 3.5–5.1)
PROTEIN TOTAL: 8.4 g/dL (ref 6.4–8.9)
SODIUM: 141 mmol/L (ref 136–145)

## 2022-04-08 LAB — CBC
HCT: 42.9 % (ref 36.7–47.1)
HGB: 14.7 g/dL (ref 12.5–16.3)
MCH: 34.2 pg — ABNORMAL HIGH (ref 23.8–33.4)
MCHC: 34.4 g/dL (ref 32.5–36.3)
MCV: 99.4 fL — ABNORMAL HIGH (ref 73.0–96.2)
MPV: 8.2 fL (ref 7.4–11.4)
PLATELETS: 143 10*3/uL (ref 140–440)
RBC: 4.31 10*6/uL (ref 4.06–5.63)
RDW: 13.7 % (ref 12.1–16.2)
WBC: 5.2 10*3/uL (ref 3.6–10.2)

## 2022-04-08 LAB — MAGNESIUM: MAGNESIUM: 2.6 mg/dL (ref 1.9–2.7)

## 2022-04-08 LAB — VITAMIN B12: VITAMIN B 12: 675 pg/mL (ref 180–914)

## 2022-04-08 LAB — VITAMIN D 25 TOTAL: VITAMIN D: 72 ng/mL (ref 30–100)

## 2022-04-08 LAB — HGA1C (HEMOGLOBIN A1C WITH EST AVG GLUCOSE): HEMOGLOBIN A1C: 5.1 % (ref 4.0–6.0)

## 2022-04-08 LAB — THYROID STIMULATING HORMONE (SENSITIVE TSH): TSH: 2.651 u[IU]/mL (ref 0.450–5.330)

## 2022-04-08 LAB — PSA SCREENING: PSA: 1.19 ng/mL (ref <=4.00)

## 2022-04-08 MED ORDER — AMPICILLIN 500 MG CAPSULE
ORAL_CAPSULE | ORAL | 1 refills | Status: DC
Start: 2022-04-08 — End: 2022-11-28

## 2022-04-08 MED ORDER — FLUTICASONE FUROATE 100 MCG-VILANTEROL 25 MCG/DOSE INHALATION POWDER
1.0000 | DISK | Freq: Every day | RESPIRATORY_TRACT | 1 refills | Status: AC
Start: 2022-04-08 — End: 2022-07-07

## 2022-04-08 NOTE — Nursing Note (Signed)
Patient here today for 6 month follow-up . Patient states that his left thumb is swollen and its been like that for 6 weeks.

## 2022-04-08 NOTE — Assessment & Plan Note (Addendum)
Managed by Dr. Viann Shove, Cardiologist.

## 2022-04-08 NOTE — Progress Notes (Signed)
[place] [facility] Jesse Cooper  [facility]      Jesse Cooper.  MRN: [id number]  DOB: 01-30-1941  Date of Service: 04/10/2022    CHIEF COMPLAINT  Chief Complaint   Patient presents with    Follow Up     6 month follow-up        SUBJECTIVE  Jesse Cooper. is a 81 y.o. male who presents to clinic for chronic disease management.     Patient complains of pain to left thumb joint with movement for the past week.  Denies injury or repetitive use.  Denies numbness or tingling.  Denies loss of thumb grip or strength.    Review of Systems:  Positive ROS discussed in HPI, otherwise all other systems negative.      Medications:   albuterol sulfate (PROVENTIL OR VENTOLIN OR PROAIR) 90 mcg/actuation Inhalation oral inhaler, Take 2 Puffs by inhalation Every 6 hours as needed  carvediloL (COREG) 25 mg Oral Tablet, Take 1 Tablet (25 mg total) by mouth Twice daily  cholecalciferol, vitamin D3, 1,250 mcg (50,000 unit) Oral Capsule, TAKE ONE CAPSULE BY MOUTH ONCE EVERY SEVEN DAYS  diltiazem HCl (DILT-XR) 240 mg Oral Capsule,Degradable Cnt Release, Take 1 Capsule (240 mg total) by mouth Once a day  ELIQUIS 5 mg Oral Tablet, Take 1 Tablet (5 mg total) by mouth Twice daily  Magnesium 250 mg Oral Tablet, Take 1 Tablet (250 mg total) by mouth Once a day  multivitamin (SUPER THERA VITE M) Oral Tablet, Take 1 Tablet by mouth  rosuvastatin (CRESTOR) 20 mg Oral Tablet, Take 1 Tablet (20 mg total) by mouth Once a day  ampicillin (PRINCIPEN) 500 mg Oral Capsule, TAKE ONE CAPSULE BY MOUTH ONCE EVERY DAY  ampicillin (PRINCIPEN) 500 mg Oral Capsule, Take 1 Capsule (500 mg total) by mouth Four times a day for 90 days  losartan-hydrochlorothiazide (HYZAAR) 100-25 mg Oral Tablet, TAKE ONE TABLET BY MOUTH ONCE EVERY DAY (Patient not taking: Reported on 04/08/2022)  traMADoL (ULTRAM) 50 mg Oral Tablet, Take 1 Tablet (50 mg total) by mouth Every 8 hours as needed    No facility-administered medications prior to  visit.      Allergies:   Allergies   Allergen Reactions    Hydrocodone-Homatropine Itching         OBJECTIVE  BP 125/72 (Site: Left, Patient Position: Sitting)   Pulse 67   Temp 36.8 C (98.2 F) (Temporal)   Resp 18   Ht 1.791 m (5' 10.5")   Wt 79.9 kg (176 lb 2 oz)   SpO2 99%   BMI 24.91 kg/m       Physical Exam  Vitals reviewed.   Constitutional:       Appearance: Normal appearance.   HENT:      Right Ear: Tympanic membrane normal.      Left Ear: Tympanic membrane normal.   Cardiovascular:      Rate and Rhythm: Normal rate. Rhythm irregular.      Pulses: Normal pulses.           Radial pulses are 2+ on the right side and 2+ on the left side.      Heart sounds: Normal heart sounds, S1 normal and S2 normal.   Pulmonary:      Effort: Pulmonary effort is normal.      Breath sounds: Normal breath sounds.   Abdominal:      General: Bowel sounds are normal.   Musculoskeletal:  Right hand: Normal.      Left hand: Tenderness present. Decreased range of motion.      Cervical back: Neck supple.      Right lower leg: No edema.      Left lower leg: No edema.      Comments: Left thumb cmc joint tender with movement.  Non-tender with palpation.  No erythema or edema noted.   Skin:     General: Skin is warm.   Neurological:      General: No focal deficit present.      Mental Status: He is alert and oriented to person, place, and time. Mental status is at baseline.      Cranial Nerves: Cranial nerves 2-12 are intact.      Motor: Motor function is intact.      Coordination: Coordination is intact.      Gait: Gait is intact.   Psychiatric:         Speech: Speech normal.         Behavior: Behavior normal.         Cognition and Memory: Cognition normal.         ASSESSMENT/PLAN  (I10) Essential hypertension  (primary encounter diagnosis)  Plan: HGA1C (HEMOGLOBIN A1C WITH EST AVG GLUCOSE),         CBC, COMPREHENSIVE METABOLIC PNL, FASTING,         LIPID PANEL, MAGNESIUM, THYROID STIMULATING         HORMONE (SENSITIVE TSH),  VITAMIN D 25 TOTAL,         VITAMIN B12    (E78.2) Mixed hyperlipidemia  Plan: HGA1C (HEMOGLOBIN A1C WITH EST AVG GLUCOSE),         CBC, COMPREHENSIVE METABOLIC PNL, FASTING,         LIPID PANEL, MAGNESIUM, THYROID STIMULATING         HORMONE (SENSITIVE TSH), VITAMIN D 25 TOTAL,         VITAMIN B12    (I48.91) Atrial fibrillation (CMS HCC)    (J45.20) Mild intermittent asthma    (Z79.01) Long term current use of anticoagulant therapy    (G47.30) Sleep apnea  Plan: HGA1C (HEMOGLOBIN A1C WITH EST AVG GLUCOSE),         CBC, COMPREHENSIVE METABOLIC PNL, FASTING,         LIPID PANEL, MAGNESIUM, THYROID STIMULATING         HORMONE (SENSITIVE TSH), VITAMIN D 25 TOTAL,         VITAMIN B12    (Z74.09) Impaired functional mobility, balance, gait, and endurance  Plan: HGA1C (HEMOGLOBIN A1C WITH EST AVG GLUCOSE),         CBC, COMPREHENSIVE METABOLIC PNL, FASTING,         LIPID PANEL, MAGNESIUM, THYROID STIMULATING         HORMONE (SENSITIVE TSH), VITAMIN D 25 TOTAL,         VITAMIN B12    (L73.9) Chronic folliculitis  Plan: HGA1C (HEMOGLOBIN A1C WITH EST AVG GLUCOSE),         CBC, COMPREHENSIVE METABOLIC PNL, FASTING,         LIPID PANEL, MAGNESIUM, THYROID STIMULATING         HORMONE (SENSITIVE TSH), VITAMIN D 25 TOTAL,         VITAMIN B12    (E55.9) Vitamin D deficiency, unspecified  Plan: HGA1C (HEMOGLOBIN A1C WITH EST AVG GLUCOSE),         CBC, COMPREHENSIVE METABOLIC PNL, FASTING,  LIPID PANEL, MAGNESIUM, THYROID STIMULATING         HORMONE (SENSITIVE TSH), VITAMIN D 25 TOTAL,         VITAMIN B12    (R73.01) IFG (impaired fasting glucose)  Plan: HGA1C (HEMOGLOBIN A1C WITH EST AVG GLUCOSE),         CBC, COMPREHENSIVE METABOLIC PNL, FASTING,         LIPID PANEL, MAGNESIUM, THYROID STIMULATING         HORMONE (SENSITIVE TSH), VITAMIN D 25 TOTAL,         VITAMIN B12    (E83.42) Hypomagnesemia  Plan: HGA1C (HEMOGLOBIN A1C WITH EST AVG GLUCOSE),         CBC, COMPREHENSIVE METABOLIC PNL, FASTING,         LIPID PANEL,  MAGNESIUM, THYROID STIMULATING         HORMONE (SENSITIVE TSH), VITAMIN D 25 TOTAL,         VITAMIN B12    (Z12.5) Prostate cancer screening  Plan: PSA SCREENING    (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 Renda Rolls, Cardiologist.         Essential hypertension - Primary    Relevant Orders    HGA1C (HEMOGLOBIN A1C WITH EST AVG GLUCOSE)    CBC    COMPREHENSIVE METABOLIC PNL, FASTING    LIPID PANEL    MAGNESIUM    THYROID STIMULATING HORMONE (SENSITIVE TSH)    VITAMIN D 25 TOTAL    VITAMIN B12    Mixed hyperlipidemia    Relevant Orders    HGA1C (HEMOGLOBIN A1C WITH EST AVG GLUCOSE)    CBC    COMPREHENSIVE METABOLIC PNL, FASTING    LIPID PANEL    MAGNESIUM    THYROID STIMULATING HORMONE (SENSITIVE TSH)    VITAMIN D 25 TOTAL    VITAMIN B12       Respiratory    Sleep apnea     Reports he quit using CPAP in Sept 2023, reports he is sleeping better.         Relevant Orders    HGA1C (HEMOGLOBIN A1C WITH EST AVG GLUCOSE)    CBC    COMPREHENSIVE METABOLIC PNL, FASTING    LIPID PANEL    MAGNESIUM    THYROID STIMULATING HORMONE (SENSITIVE TSH)    VITAMIN D 25 TOTAL    VITAMIN B12    Mild intermittent asthma     Continue Breo daily.  Uses albuterol PRN; mostly in spring and fall.  Reports has not used albuterol is > 6 mo.            Endocrine    IFG (impaired fasting glucose)     Managing with diet and exercise.         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  Patient can also use over-the-counter Voltaren gel as needed.  Advised to follow-up if symptoms progress or do not resolve.  Patient declined ortho referral today.            Dermatology    Chronic folliculitis     Well controlled with ampicillin 500 mg daily > 30 years.  Continue current medication.         Relevant Orders    HGA1C (HEMOGLOBIN A1C WITH EST AVG GLUCOSE)    CBC    COMPREHENSIVE METABOLIC PNL, FASTING    LIPID PANEL    MAGNESIUM    THYROID STIMULATING HORMONE (SENSITIVE TSH)    VITAMIN D 25 TOTAL    VITAMIN B12       Other    Hypomagnesemia     Continue over-the-counter magnesium 250 mg daily         Relevant Orders    HGA1C (HEMOGLOBIN A1C WITH EST AVG GLUCOSE)    CBC    COMPREHENSIVE METABOLIC PNL, FASTING    LIPID PANEL    MAGNESIUM    THYROID STIMULATING HORMONE (SENSITIVE TSH)    VITAMIN D 25 TOTAL    VITAMIN B12    Impaired functional mobility, balance, gait, and endurance     Discussed fall risk.  Patient is ambulatory without a cane.  Has been doing well.  Drives.  No concerns voiced today.         Relevant Orders    HGA1C (HEMOGLOBIN A1C WITH EST AVG GLUCOSE)    CBC    COMPREHENSIVE METABOLIC PNL, FASTING    LIPID PANEL    MAGNESIUM    THYROID STIMULATING HORMONE (SENSITIVE TSH)    VITAMIN D 25 TOTAL    VITAMIN B12    Long term current use of anticoagulant therapy     Continue Eliquis 5 mg BID .         RESOLVED: Prostate cancer screening    Relevant Orders    PSA SCREENING (Completed)     Labs drawn today.  Continue current medications  as prescribed.  Advised to follow up with specialist as scheduled.  Advised a low-fat/low sodium diet, advised 150 minutes of scheduled weekly physical activity as tolerated.  Advised to maintain a healthy weight.       Return in about 6 months (around  10/07/2022) for routine visit.    Stormy Fabian, NP-C 04/10/2022, 11:47

## 2022-04-10 ENCOUNTER — Other Ambulatory Visit (RURAL_HEALTH_CENTER): Payer: Self-pay | Admitting: Family

## 2022-04-10 ENCOUNTER — Encounter (RURAL_HEALTH_CENTER): Payer: Self-pay | Admitting: Family

## 2022-04-10 DIAGNOSIS — M1812 Unilateral primary osteoarthritis of first carpometacarpal joint, left hand: Secondary | ICD-10-CM | POA: Insufficient documentation

## 2022-04-10 DIAGNOSIS — N183 Chronic kidney disease, stage 3 unspecified (CMS HCC): Secondary | ICD-10-CM | POA: Insufficient documentation

## 2022-04-10 HISTORY — DX: Chronic kidney disease, stage 3 unspecified: N18.30

## 2022-04-10 NOTE — Assessment & Plan Note (Signed)
Advised over-the-counter Tylenol Arthritis as needed.  Patient can also use over-the-counter Voltaren gel as needed.  Advised to follow-up if symptoms progress or do not resolve.  Patient declined ortho referral today.

## 2022-04-10 NOTE — Assessment & Plan Note (Signed)
Continue weekly vitamin D 50, 000 IU

## 2022-04-10 NOTE — Assessment & Plan Note (Signed)
Discussed fall risk.  Patient is ambulatory without a cane.  Has been doing well.  Drives.  No concerns voiced today.

## 2022-04-10 NOTE — Assessment & Plan Note (Signed)
Continue over-the-counter magnesium 250 mg daily

## 2022-04-10 NOTE — Assessment & Plan Note (Signed)
Managing with diet and exercise.

## 2022-04-10 NOTE — Assessment & Plan Note (Signed)
Well controlled with ampicillin 500 mg daily > 30 years.  Continue current medication.

## 2022-04-10 NOTE — Assessment & Plan Note (Signed)
Reports he quit using CPAP in Sept 2023, reports he is sleeping better.

## 2022-04-10 NOTE — Assessment & Plan Note (Signed)
Continue Eliquis 5mg BID 

## 2022-04-10 NOTE — Assessment & Plan Note (Signed)
Continue Breo daily.  Uses albuterol PRN; mostly in spring and fall.  Reports has not used albuterol is > 6 mo.

## 2022-06-29 IMAGING — MR MRI BRAIN W/O CONTRAST
8 of 9 series · 37 of 48 positions shown · non-contrast
Comparison: None available.

﻿EXAM:  MRI BRAIN W/O CONTRAST
INDICATION: 81-year-old with history of memory loss and polyneuropathy.
TECHNIQUE: Multiplanar, multisequential MRI of the brain was performed without administration of IV contrast.

[Series 5: DWI · axial · 5.0mm · 1.46mm/px · z∈[-3,+117]mm · 10 of 88 slices shown (1 of 3)]
[im 6/88]
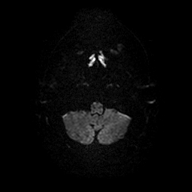
[im 12/88]
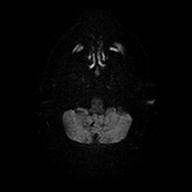
[im 18/88]
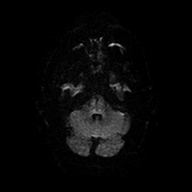
[im 30/88]
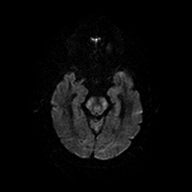
[im 41/88]
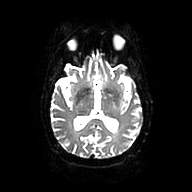
[im 47/88]
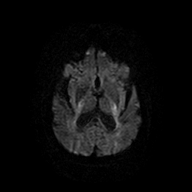
[im 53/88]
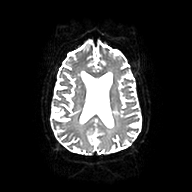
[im 64/88]
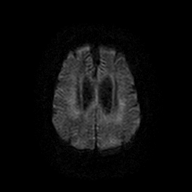
[im 76/88]
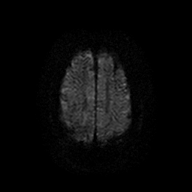
[im 88/88]
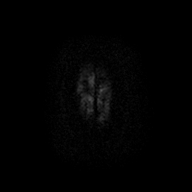

[Series 6: DWI · axial · 5.0mm · 1.46mm/px · z∈[-9,+117]mm · 4 of 22 slices shown (2 of 3)]
[im 1/22]
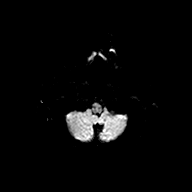
[im 8/22]
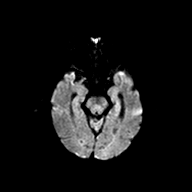
[im 15/22]
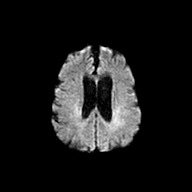
[im 22/22]
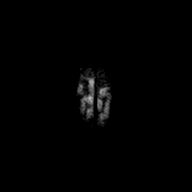

[Series 7: DWI · axial · 5.0mm · 1.46mm/px · z∈[-9,+117]mm · 4 of 22 slices shown (3 of 3)]
[im 1/22]
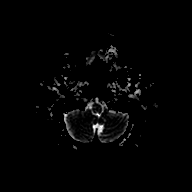
[im 8/22]
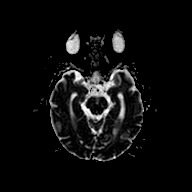
[im 15/22]
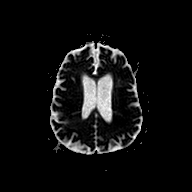
[im 22/22]
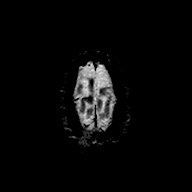

[Series 8: FLAIR · sagittal · 4.0mm · 0.78mm/px · 4 of 26 slices shown (1 of 2)]
[im 1/26]
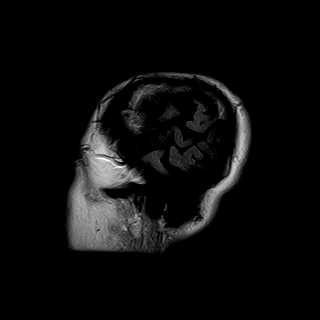
[im 9/26]
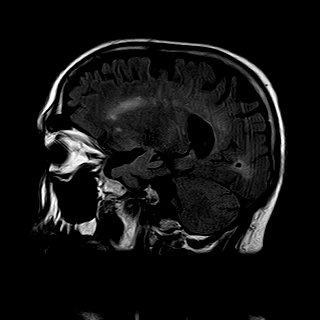
[im 17/26]
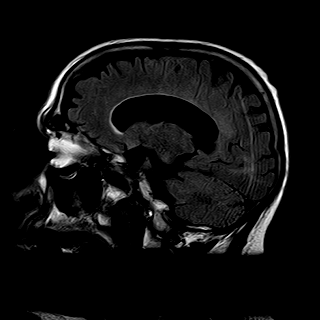
[im 26/26]
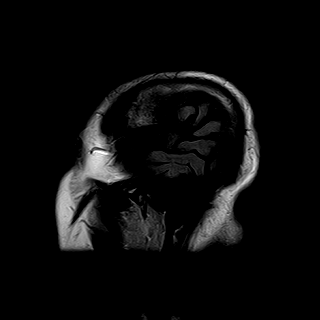

[Series 9: T2 · axial · 5.0mm · 0.47mm/px · z∈[-18,+126]mm · 4 of 25 slices shown (1 of 2)]
[im 1/25]
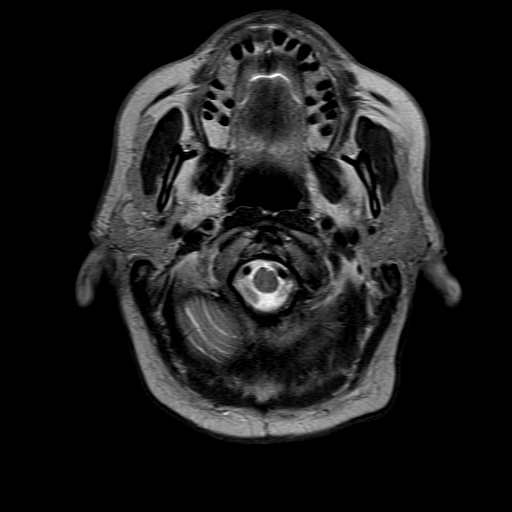
[im 9/25]
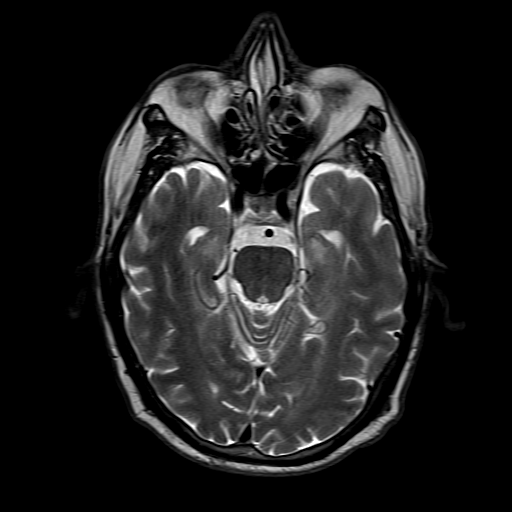
[im 17/25]
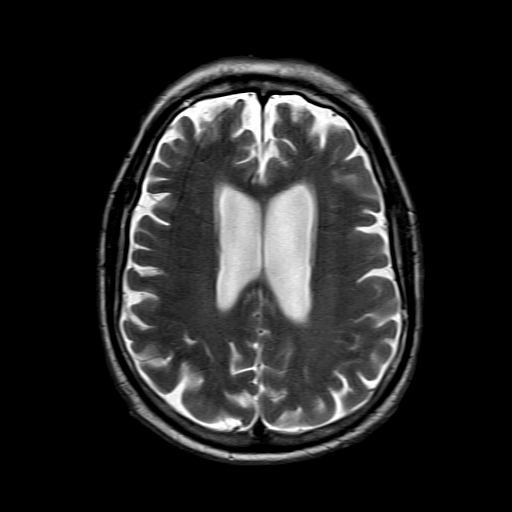
[im 25/25]
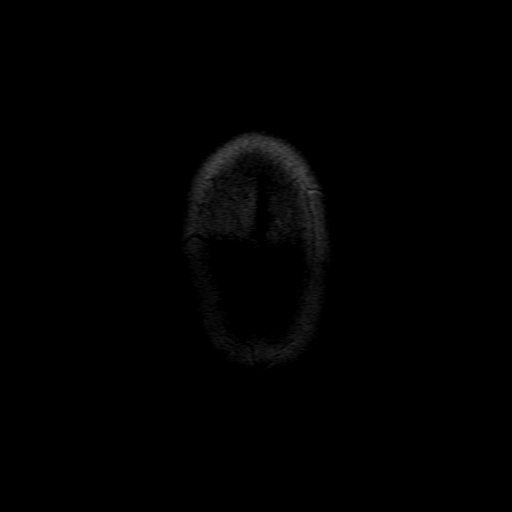

[Series 10: FLAIR · axial · 5.0mm · 0.47mm/px · z∈[-18,+126]mm · 4 of 25 slices shown (2 of 2)]
[im 1/25]
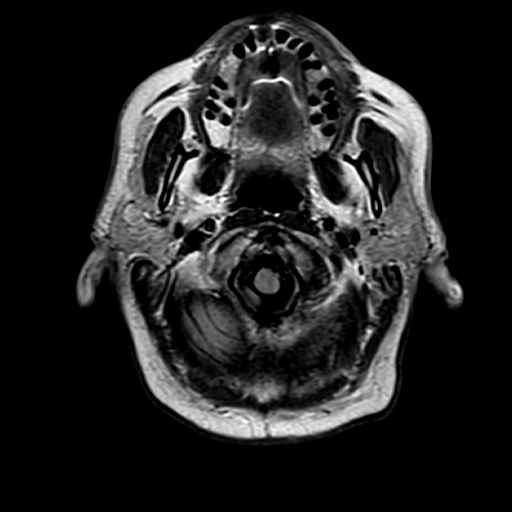
[im 9/25]
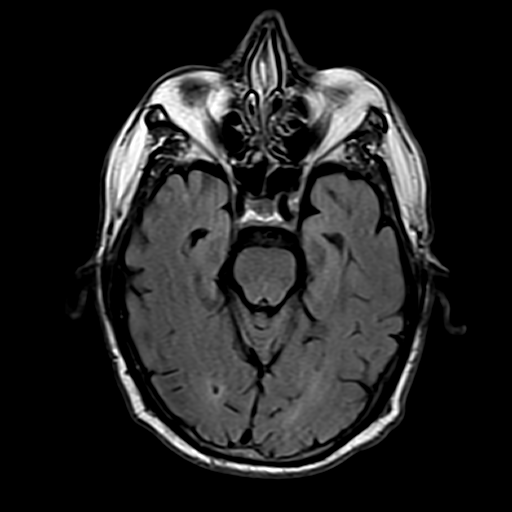
[im 17/25]
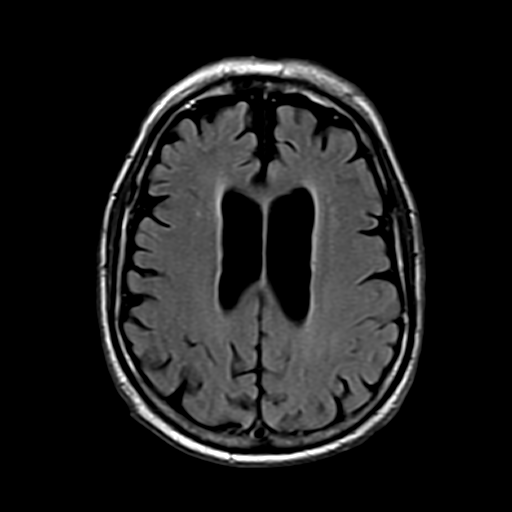
[im 25/25]
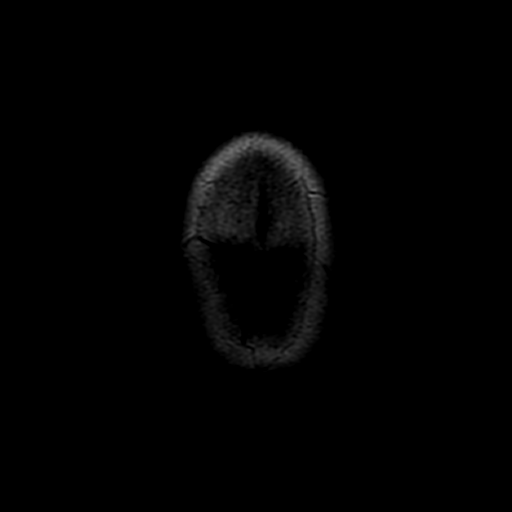

[Series 11: T1 · axial · 5.0mm · 0.47mm/px · z∈[-18,+78]mm · 3 of 25 slices shown]
[im 1/25]
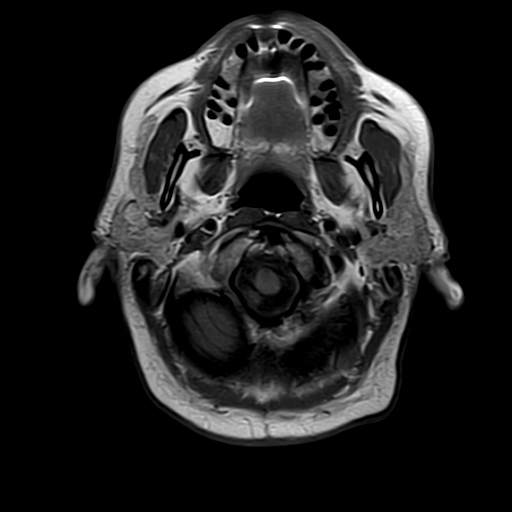
[im 9/25]
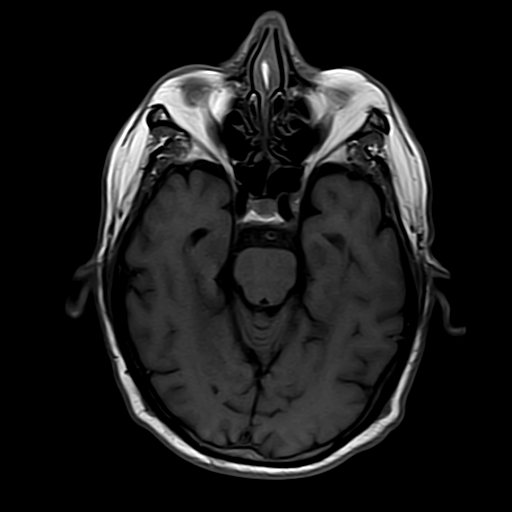
[im 17/25]
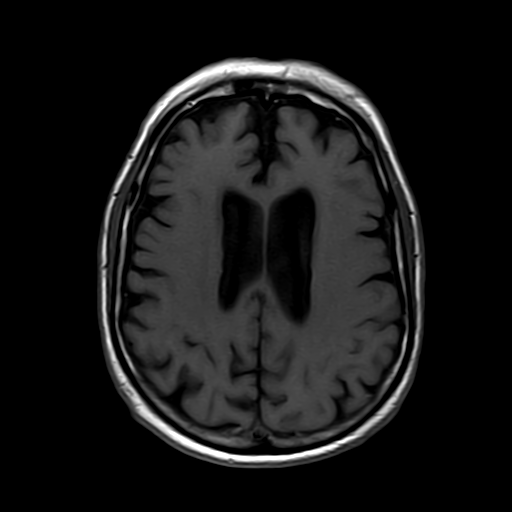

[Series 13: T2 · coronal · 6.0mm · 0.47mm/px · 4 of 24 slices shown (2 of 2)]
[im 1/24]
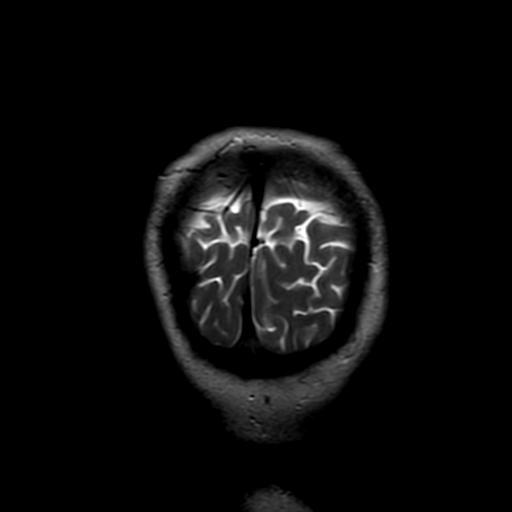
[im 8/24]
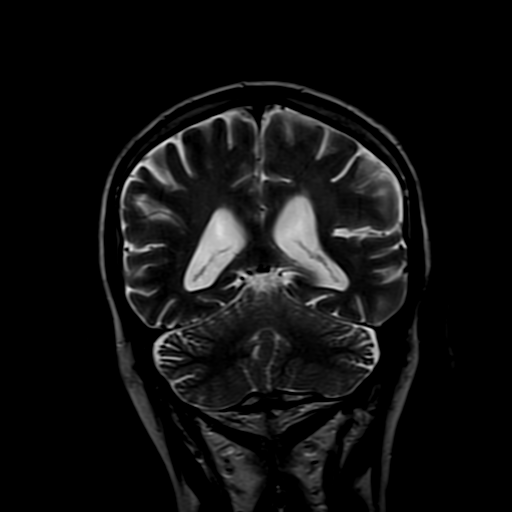
[im 16/24]
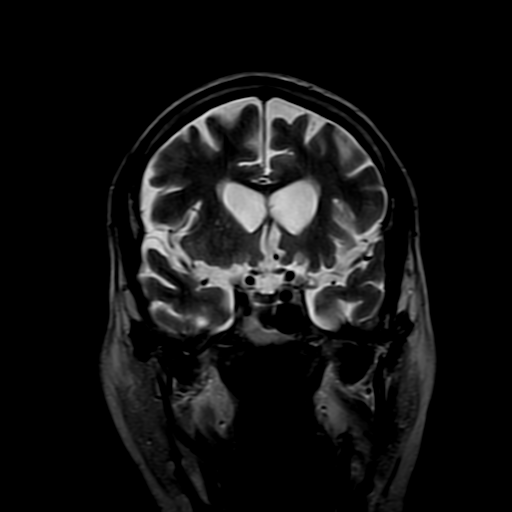
[im 24/24]
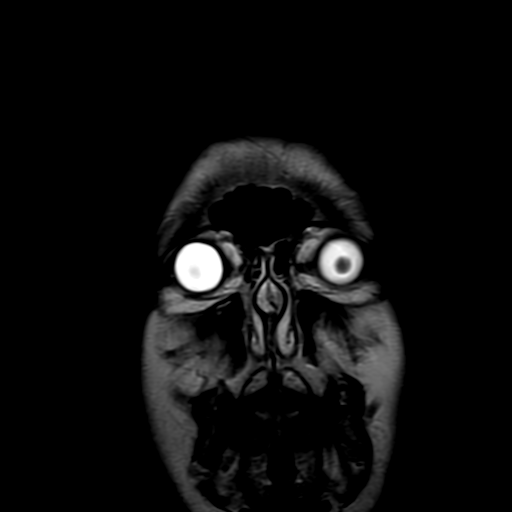

[37 of 48 positions shown; findings below may reference images not displayed]

FINDINGS: No focal areas of restricted diffusion on diffusion sequence.  No intracranial bleed or extra-axial collections.  No evidence of ventriculomegaly or midline shift. 

Major arteries of circle of Willis and dural venous sinuses are patent.

Age-appropriate, symmetric cerebral and cerebellar atrophy are noted.
IMPRESSION: 1. No acute ischemia or intracranial space-occupying lesions. 

2. No evidence of ventriculomegaly or midline shift. Age-appropriate, symmetric cerebral and cerebellar atrophy.

## 2022-06-30 ENCOUNTER — Telehealth (RURAL_HEALTH_CENTER): Payer: Self-pay | Admitting: Family

## 2022-06-30 MED ORDER — ROSUVASTATIN 20 MG TABLET
20.0000 mg | ORAL_TABLET | Freq: Every day | ORAL | 1 refills | Status: DC
Start: 2022-06-30 — End: 2023-04-03

## 2022-08-22 ENCOUNTER — Telehealth (RURAL_HEALTH_CENTER): Payer: Self-pay | Admitting: Family

## 2022-08-22 ENCOUNTER — Other Ambulatory Visit (RURAL_HEALTH_CENTER): Payer: Self-pay | Admitting: Family

## 2022-08-22 MED ORDER — LOSARTAN 100 MG TABLET
100.0000 mg | ORAL_TABLET | Freq: Every day | ORAL | 1 refills | Status: DC
Start: 2022-08-22 — End: 2023-02-22

## 2022-08-22 MED ORDER — HYDROCHLOROTHIAZIDE 25 MG TABLET
25.0000 mg | ORAL_TABLET | Freq: Every day | ORAL | 1 refills | Status: DC
Start: 2022-08-22 — End: 2022-10-06

## 2022-08-22 NOTE — Telephone Encounter (Signed)
Pt daughter called, neurologist is wanting to put him on 2 dementia medications. He is concerned about low pressures during the day. Asked if you can do 2 new scripts for the losartan, take 100 mg at night and then 25 mg in the morning.

## 2022-08-23 NOTE — Telephone Encounter (Signed)
Notified patient's daughter

## 2022-09-20 ENCOUNTER — Other Ambulatory Visit (RURAL_HEALTH_CENTER): Payer: Self-pay | Admitting: Family

## 2022-09-20 DIAGNOSIS — E559 Vitamin D deficiency, unspecified: Secondary | ICD-10-CM

## 2022-10-06 ENCOUNTER — Telehealth (RURAL_HEALTH_CENTER): Payer: Self-pay | Admitting: Family

## 2022-10-06 ENCOUNTER — Ambulatory Visit: Payer: Medicare Other | Attending: Family | Admitting: Family

## 2022-10-06 ENCOUNTER — Ambulatory Visit (RURAL_HEALTH_CENTER): Payer: Medicare Other | Attending: Family | Admitting: Family

## 2022-10-06 ENCOUNTER — Other Ambulatory Visit: Payer: Self-pay

## 2022-10-06 ENCOUNTER — Encounter (RURAL_HEALTH_CENTER): Payer: Self-pay | Admitting: Family

## 2022-10-06 VITALS — BP 127/74 | HR 70 | Temp 98.6°F | Resp 15 | Ht 70.0 in | Wt 178.0 lb

## 2022-10-06 DIAGNOSIS — N183 Chronic kidney disease, stage 3 unspecified: Secondary | ICD-10-CM | POA: Insufficient documentation

## 2022-10-06 DIAGNOSIS — I10 Essential (primary) hypertension: Secondary | ICD-10-CM | POA: Insufficient documentation

## 2022-10-06 DIAGNOSIS — E559 Vitamin D deficiency, unspecified: Secondary | ICD-10-CM | POA: Insufficient documentation

## 2022-10-06 DIAGNOSIS — E782 Mixed hyperlipidemia: Secondary | ICD-10-CM | POA: Insufficient documentation

## 2022-10-06 DIAGNOSIS — I5032 Chronic diastolic (congestive) heart failure: Secondary | ICD-10-CM | POA: Insufficient documentation

## 2022-10-06 DIAGNOSIS — R7301 Impaired fasting glucose: Secondary | ICD-10-CM | POA: Insufficient documentation

## 2022-10-06 DIAGNOSIS — I4811 Longstanding persistent atrial fibrillation: Secondary | ICD-10-CM | POA: Insufficient documentation

## 2022-10-06 DIAGNOSIS — Z7901 Long term (current) use of anticoagulants: Secondary | ICD-10-CM | POA: Insufficient documentation

## 2022-10-06 DIAGNOSIS — I130 Hypertensive heart and chronic kidney disease with heart failure and stage 1 through stage 4 chronic kidney disease, or unspecified chronic kidney disease: Secondary | ICD-10-CM | POA: Insufficient documentation

## 2022-10-06 DIAGNOSIS — I509 Heart failure, unspecified: Secondary | ICD-10-CM | POA: Insufficient documentation

## 2022-10-06 DIAGNOSIS — Z7409 Other reduced mobility: Secondary | ICD-10-CM | POA: Insufficient documentation

## 2022-10-06 DIAGNOSIS — J449 Chronic obstructive pulmonary disease, unspecified: Secondary | ICD-10-CM | POA: Insufficient documentation

## 2022-10-06 LAB — LIPID PANEL
CHOL/HDL RATIO: 1.9
CHOLESTEROL: 143 mg/dL (ref <200)
HDL CHOL: 76 mg/dL (ref >=40)
LDL CALC: 56 mg/dL (ref 0–100)
TRIGLYCERIDES: 53 mg/dL (ref <=150)
VLDL CALC: 11 mg/dL (ref 0–50)

## 2022-10-06 LAB — COMPREHENSIVE METABOLIC PNL, FASTING
ALBUMIN/GLOBULIN RATIO: 1.6 — ABNORMAL HIGH (ref 0.8–1.4)
ALBUMIN: 4.8 g/dL (ref 3.5–5.7)
ALKALINE PHOSPHATASE: 64 U/L (ref 34–104)
ALT (SGPT): 26 U/L (ref 7–52)
ANION GAP: 10 mmol/L (ref 4–13)
AST (SGOT): 32 U/L (ref 13–39)
BILIRUBIN TOTAL: 0.8 mg/dL (ref 0.3–1.2)
BUN/CREA RATIO: 33 — ABNORMAL HIGH (ref 6–22)
BUN: 32 mg/dL — ABNORMAL HIGH (ref 7–25)
CALCIUM, CORRECTED: 9.6 mg/dL (ref 8.9–10.8)
CALCIUM: 10.2 mg/dL (ref 8.6–10.3)
CHLORIDE: 106 mmol/L (ref 98–107)
CO2 TOTAL: 26 mmol/L (ref 21–31)
CREATININE: 0.98 mg/dL (ref 0.60–1.30)
ESTIMATED GFR: 77 mL/min/{1.73_m2} (ref >59)
GLOBULIN: 3 (ref 2.9–5.4)
GLUCOSE: 96 mg/dL (ref 74–109)
OSMOLALITY, CALCULATED: 290 mOsm/kg (ref 270–290)
POTASSIUM: 4.6 mmol/L (ref 3.5–5.1)
PROTEIN TOTAL: 7.8 g/dL (ref 6.4–8.9)
SODIUM: 142 mmol/L (ref 136–145)

## 2022-10-06 LAB — VITAMIN B12: VITAMIN B 12: 554 pg/mL (ref 180–914)

## 2022-10-06 LAB — CBC
HCT: 44 % (ref 36.7–47.1)
HGB: 14.9 g/dL (ref 12.5–16.3)
MCH: 34.6 pg — ABNORMAL HIGH (ref 23.8–33.4)
MCHC: 34 g/dL (ref 32.5–36.3)
MCV: 101.9 fL — ABNORMAL HIGH (ref 73.0–96.2)
MPV: 8.1 fL (ref 7.4–11.4)
PLATELET COMMENT: ADEQUATE
PLATELETS: 133 10*3/uL — ABNORMAL LOW (ref 140–440)
RBC COMMENT: NORMAL
RBC: 4.32 10*6/uL (ref 4.06–5.63)
RDW: 13.4 % (ref 12.1–16.2)
WBC: 5.2 10*3/uL (ref 3.6–10.2)

## 2022-10-06 LAB — MAGNESIUM: MAGNESIUM: 2.6 mg/dL (ref 1.9–2.7)

## 2022-10-06 LAB — VITAMIN D 25 TOTAL: VITAMIN D: 70 ng/mL (ref 30–100)

## 2022-10-06 LAB — THYROID STIMULATING HORMONE (SENSITIVE TSH): TSH: 2.192 u[IU]/mL (ref 0.450–5.330)

## 2022-10-06 NOTE — Assessment & Plan Note (Signed)
Continue Eliquis 5mg BID 

## 2022-10-06 NOTE — Assessment & Plan Note (Signed)
Continue rosuvastatin 20 mg daily. 

## 2022-10-06 NOTE — Assessment & Plan Note (Signed)
Reports he quit using CPAP in Sept 2023, reports he is sleeping better.  No longer following with Pulmonology.

## 2022-10-06 NOTE — Progress Notes (Signed)
[place] [facility]  [facility]      [name].  MRN: [id number]  DOB: [birth year]  Date of Service: 10/06/2022    CHIEF COMPLAINT  Chief Complaint   Patient presents with    Follow Up 6 Months     Routine visit       SUBJECTIVE  Jesse Cooper. is a 82 y.o. male who presents to clinic for chronic disease management.     Patient complains of pain to left thumb joint with movement for the past week.  Denies injury or repetitive use.  Denies numbness or tingling.  Denies loss of thumb grip or strength.    Review of Systems:  Positive ROS discussed in HPI, otherwise all other systems negative.      Medications:   albuterol sulfate (PROVENTIL OR VENTOLIN OR PROAIR) 90 mcg/actuation Inhalation oral inhaler, Take 2 Puffs by inhalation Every 6 hours as needed  ampicillin (PRINCIPEN) 500 mg Oral Capsule, TAKE ONE CAPSULE BY MOUTH ONCE EVERY DAY  carvediloL (COREG) 25 mg Oral Tablet, Take 1 Tablet (25 mg total) by mouth Twice daily  cholecalciferol, vitamin D3, 1,250 mcg (50,000 unit) Oral Capsule, TAKE ONE CAPSULE BY MOUTH ONCE EVERY SEVEN DAYS  ELIQUIS 5 mg Oral Tablet, Take 1 Tablet (5 mg total) by mouth Twice daily  JARDIANCE 10 mg Oral Tablet, Take 1 Tablet (10 mg total) by mouth Once a day  losartan (COZAAR) 100 mg Oral Tablet, Take 1 Tablet (100 mg total) by mouth Once a day  Magnesium 250 mg Oral Tablet, Take 1 Tablet (250 mg total) by mouth Once a day  memantine (NAMENDA) 10 mg Oral Tablet, TAKE 1/2 TABLET BY MOUTH ONCE EVERY DAY at bedtime FOR 7 DAYS THEN TAKE 1/2 TABLET TWICE DAILY FOR 7 DAYS THEN TAKE 1/2 TABLET EVERY MORNING AND TAKE ONE TABLET AT BEDTIME FOR 7 DAYS THEN TAKE ONE TABLET TWICE DAILY THEREAFTER  multivitamin (SUPER THERA VITE M) Oral Tablet, Take 1 Tablet by mouth  rosuvastatin (CRESTOR) 20 mg Oral Tablet, Take 1 Tablet (20 mg total) by mouth Once a day  diltiazem HCl (DILT-XR) 240 mg Oral Capsule,Degradable Cnt Release, Take 1 Capsule (240 mg total)  by mouth Once a day  hydroCHLOROthiazide (HYDRODIURIL) 25 mg Oral Tablet, Take 1 Tablet (25 mg total) by mouth Once a day    No facility-administered medications prior to visit.      Allergies:   Allergies   Allergen Reactions    Hydrocodone-Homatropine Itching         OBJECTIVE  BP 127/74   Pulse 70   Temp 37 C (98.6 F)   Resp 15   Ht 1.778 m (5\' 10" )   Wt 80.7 kg (178 lb)   SpO2 98%   BMI 25.54 kg/m       Physical Exam  Vitals reviewed.   Constitutional:       Appearance: Normal appearance.   HENT:      Right Ear: Tympanic membrane normal.      Left Ear: Tympanic membrane normal.   Cardiovascular:      Rate and Rhythm: Normal rate. Rhythm irregular.      Pulses: Normal pulses.           Radial pulses are 2+ on the right side and 2+ on the left side.      Heart sounds: Normal heart sounds, S1 normal and S2 normal.   Pulmonary:      Effort: Pulmonary  effort is normal.      Breath sounds: Normal breath sounds.   Abdominal:      General: Bowel sounds are normal.   Musculoskeletal:      Right hand: Normal.      Left hand: Tenderness present. Decreased range of motion.      Cervical back: Neck supple.      Right lower leg: No edema.      Left lower leg: No edema.      Comments: Left thumb cmc joint tender with movement.  Non-tender with palpation.  No erythema or edema noted.   Skin:     General: Skin is warm.   Neurological:      General: No focal deficit present.      Mental Status: He is alert and oriented to person, place, and time. Mental status is at baseline.      Cranial Nerves: Cranial nerves 2-12 are intact.      Motor: Motor function is intact.      Coordination: Coordination is intact.      Gait: Gait is intact.   Psychiatric:         Speech: Speech normal.         Behavior: Behavior normal.         Cognition and Memory: Cognition normal.         ASSESSMENT/PLAN  (J44.9) COPD (chronic obstructive pulmonary disease) (CMS HCC)  (primary encounter diagnosis)  Plan: HGA1C (HEMOGLOBIN A1C WITH EST AVG  GLUCOSE),         CBC, COMPREHENSIVE METABOLIC PNL, FASTING,         LIPID PANEL, MAGNESIUM, THYROID STIMULATING         HORMONE (SENSITIVE TSH), VITAMIN D 25 TOTAL,         VITAMIN B12    (I50.9) Congestive heart failure, unspecified HF chronicity, unspecified heart failure type (CMS HCC)  Plan: HGA1C (HEMOGLOBIN A1C WITH EST AVG GLUCOSE),         CBC, COMPREHENSIVE METABOLIC PNL, FASTING,         LIPID PANEL, MAGNESIUM, THYROID STIMULATING         HORMONE (SENSITIVE TSH), VITAMIN D 25 TOTAL,         VITAMIN B12    (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 (CMS HCC)  Plan: HGA1C (HEMOGLOBIN A1C WITH EST AVG GLUCOSE),         CBC, COMPREHENSIVE METABOLIC PNL, FASTING,         LIPID PANEL, MAGNESIUM, THYROID STIMULATING         HORMONE (SENSITIVE TSH), VITAMIN D 25 TOTAL,         VITAMIN B12    (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, 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 HCC)  Plan: HGA1C (HEMOGLOBIN A1C WITH EST AVG GLUCOSE),         CBC, COMPREHENSIVE METABOLIC PNL, FASTING,         LIPID PANEL, MAGNESIUM, THYROID STIMULATING         HORMONE (SENSITIVE TSH), VITAMIN D 25 TOTAL,         VITAMIN B12    (I10) Essential hypertension  Plan: HGA1C (HEMOGLOBIN A1C WITH EST AVG GLUCOSE),         CBC, COMPREHENSIVE METABOLIC PNL, FASTING,         LIPID PANEL, MAGNESIUM, THYROID STIMULATING         HORMONE (SENSITIVE TSH), VITAMIN D 25 TOTAL,         VITAMIN B12    (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  Plan: HGA1C (HEMOGLOBIN A1C WITH EST AVG GLUCOSE),         CBC, COMPREHENSIVE METABOLIC PNL, FASTING,         LIPID PANEL, MAGNESIUM, THYROID STIMULATING         HORMONE (SENSITIVE TSH), VITAMIN D 25 TOTAL,         VITAMIN B12    (Z74.09) Impaired functional mobility, balance, gait, and endurance  Plan: HGA1C (HEMOGLOBIN A1C WITH EST AVG GLUCOSE),         CBC, COMPREHENSIVE METABOLIC PNL, FASTING,         LIPID PANEL, MAGNESIUM, THYROID STIMULATING         HORMONE (SENSITIVE TSH), VITAMIN D 25 TOTAL,         VITAMIN B12    (Z79.01)  Long term current use of anticoagulant therapy  Plan: HGA1C (HEMOGLOBIN A1C WITH EST AVG GLUCOSE),         CBC, COMPREHENSIVE METABOLIC PNL, FASTING,         LIPID PANEL, MAGNESIUM, THYROID STIMULATING         HORMONE (SENSITIVE TSH), VITAMIN D 25 TOTAL,         VITAMIN B12    (E83.42) Hypomagnesemia  Plan: HGA1C (HEMOGLOBIN A1C WITH EST AVG GLUCOSE),         CBC, COMPREHENSIVE METABOLIC PNL, FASTING,         LIPID PANEL, MAGNESIUM, THYROID STIMULATING         HORMONE (SENSITIVE TSH), VITAMIN D 25 TOTAL,         VITAMIN B12    (E55.9) Vitamin D deficiency, unspecified  Plan: HGA1C (HEMOGLOBIN A1C WITH EST AVG GLUCOSE),         CBC, COMPREHENSIVE METABOLIC PNL, FASTING,         LIPID PANEL, MAGNESIUM, THYROID STIMULATING         HORMONE (SENSITIVE TSH), VITAMIN D 25 TOTAL,         VITAMIN B12    (R73.01) IFG (impaired fasting glucose)  Plan: HGA1C (HEMOGLOBIN A1C WITH EST AVG GLUCOSE),         CBC, COMPREHENSIVE METABOLIC PNL, FASTING,         LIPID PANEL, MAGNESIUM, THYROID STIMULATING         HORMONE (SENSITIVE TSH), VITAMIN D 25 TOTAL,         VITAMIN B12       Problem List Items Addressed This Visit          Cardiovascular System    Atrial fibrillation (CMS HCC)     Managed by Dr. Renda Rolls, Cardiologist.         Relevant Orders    HGA1C (HEMOGLOBIN A1C WITH EST AVG GLUCOSE)    CBC    COMPREHENSIVE METABOLIC PNL, FASTING    LIPID PANEL    MAGNESIUM    THYROID STIMULATING HORMONE (SENSITIVE TSH)    VITAMIN D 25 TOTAL    VITAMIN B12    Essential hypertension     BP controlled, continue current medications.  Discontinue HCTZ 25 mg daily.         Relevant Orders    HGA1C (HEMOGLOBIN A1C WITH EST AVG GLUCOSE)    CBC    COMPREHENSIVE METABOLIC PNL, FASTING    LIPID PANEL    MAGNESIUM    THYROID STIMULATING HORMONE (SENSITIVE TSH)    VITAMIN D 25 TOTAL    VITAMIN B12    Mixed hyperlipidemia     Continue rosuvastatin 20 mg daily.  Relevant Orders    HGA1C (HEMOGLOBIN A1C WITH EST AVG GLUCOSE)    CBC    COMPREHENSIVE  METABOLIC PNL, FASTING    LIPID PANEL    MAGNESIUM    THYROID STIMULATING HORMONE (SENSITIVE TSH)    VITAMIN D 25 TOTAL    VITAMIN B12    Congestive heart failure (CMS HCC)    Relevant Orders    HGA1C (HEMOGLOBIN A1C WITH EST AVG GLUCOSE)    CBC    COMPREHENSIVE METABOLIC PNL, FASTING    LIPID PANEL    MAGNESIUM    THYROID STIMULATING HORMONE (SENSITIVE TSH)    VITAMIN D 25 TOTAL    VITAMIN B12       Respiratory    COPD (chronic obstructive pulmonary disease) (CMS HCC) - Primary     Continue Breo and albuterol PRN.   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  Avoid OTC NSAID'S.          Relevant Orders    HGA1C (HEMOGLOBIN A1C WITH EST AVG GLUCOSE)    CBC    COMPREHENSIVE METABOLIC PNL, FASTING    LIPID PANEL    MAGNESIUM    THYROID STIMULATING HORMONE (SENSITIVE TSH)    VITAMIN D 25 TOTAL    VITAMIN B12       Endocrine    IFG (impaired fasting glucose)     Managing with diet and exercise.         Relevant Orders    HGA1C (HEMOGLOBIN A1C WITH EST AVG GLUCOSE)    CBC    COMPREHENSIVE METABOLIC PNL, FASTING    LIPID PANEL    MAGNESIUM    THYROID STIMULATING HORMONE (SENSITIVE TSH)    VITAMIN D 25 TOTAL    VITAMIN B12    Vitamin D deficiency, unspecified     Continue weekly vitamin D 50,000 IU         Relevant Orders    HGA1C (HEMOGLOBIN A1C WITH EST AVG GLUCOSE)    CBC    COMPREHENSIVE METABOLIC PNL, FASTING    LIPID PANEL    MAGNESIUM    THYROID STIMULATING HORMONE (SENSITIVE TSH)    VITAMIN D 25 TOTAL    VITAMIN B12       Other    Hypomagnesemia     Continue over-the-counter magnesium 250 mg daily         Relevant Orders    HGA1C (HEMOGLOBIN A1C WITH EST AVG GLUCOSE)     CBC    COMPREHENSIVE METABOLIC PNL, FASTING    LIPID PANEL    MAGNESIUM    THYROID STIMULATING HORMONE (SENSITIVE TSH)    VITAMIN D 25 TOTAL    VITAMIN B12    Impaired functional mobility, balance, gait, and endurance     Discussed fall risk.  Patient is ambulatory without a cane.  Has been doing well.  Drives.  No concerns voiced today.         Relevant Orders  HGA1C (HEMOGLOBIN A1C WITH EST AVG GLUCOSE)    CBC    COMPREHENSIVE METABOLIC PNL, FASTING    LIPID PANEL    MAGNESIUM    THYROID STIMULATING HORMONE (SENSITIVE TSH)    VITAMIN D 25 TOTAL    VITAMIN B12    Long term current use of anticoagulant therapy     Continue Eliquis 5 mg BID .         Relevant Orders    HGA1C (HEMOGLOBIN A1C WITH EST AVG GLUCOSE)    CBC    COMPREHENSIVE METABOLIC PNL, FASTING    LIPID PANEL    MAGNESIUM    THYROID STIMULATING HORMONE (SENSITIVE TSH)    VITAMIN D 25 TOTAL    VITAMIN B12       Labs drawn today.  Continue current medications  as prescribed.  Advised to follow up with specialist as scheduled.  Advised a low-fat/low sodium diet, advised 150 minutes of scheduled weekly physical activity as tolerated.  Advised to maintain a healthy weight.       Return in about 6 months (around 04/08/2023) for routine visit.    Stormy Fabian, NP-C 10/06/2022, 10:23  Physical Exam  Vitals reviewed.   Constitutional:       Appearance: Normal appearance.   HENT:      Right Ear: Tympanic membrane normal.      Left Ear: Tympanic membrane normal.   Cardiovascular:      Rate and Rhythm: Normal rate. Rhythm irregular.      Pulses: Normal pulses.           Radial pulses are 2+ on the right side and 2+ on the left side.      Heart sounds: Normal heart sounds, S1 normal and S2 normal.   Pulmonary:      Effort: Pulmonary effort is normal.      Breath sounds: Normal breath sounds.   Abdominal:      General: Bowel sounds are normal.   Musculoskeletal:      Right hand: Normal.      Left hand: Tenderness present. Decreased range of motion.      Cervical  back: Neck supple.      Right lower leg: No edema.      Left lower leg: No edema.      Comments: Left thumb cmc joint tender with movement.  Non-tender with palpation.  No erythema or edema noted.   Skin:     General: Skin is warm.   Neurological:      General: No focal deficit present.      Mental Status: He is alert and oriented to person, place, and time. Mental status is at baseline.      Cranial Nerves: Cranial nerves 2-12 are intact.      Motor: Motor function is intact.      Coordination: Coordination is intact.      Gait: Gait is intact.   Psychiatric:         Speech: Speech normal.         Behavior: Behavior normal.         Cognition and Memory: Cognition normal.

## 2022-10-06 NOTE — Assessment & Plan Note (Signed)
Continue weekly vitamin D 50, 000 IU

## 2022-10-06 NOTE — Assessment & Plan Note (Signed)
Continue Breo and albuterol PRN.   Does not recall last time he used albuterol

## 2022-10-06 NOTE — Assessment & Plan Note (Signed)
Continue over-the-counter magnesium 250 mg daily.

## 2022-10-06 NOTE — Assessment & Plan Note (Signed)
Managed by Dr. Javed, Cardiologist.

## 2022-10-06 NOTE — Assessment & Plan Note (Signed)
BP controlled, continue current medications.  Discontinue HCTZ 25 mg daily.

## 2022-10-06 NOTE — Assessment & Plan Note (Signed)
Discussed fall risk.  Patient is ambulatory without a cane.  Has been doing well.  Drives.  No concerns voiced today.

## 2022-10-06 NOTE — Assessment & Plan Note (Signed)
Advised to limit sodium in diet. Avoid OTC NSAID'S.

## 2022-10-06 NOTE — Assessment & Plan Note (Signed)
Managing with diet and exercise.

## 2022-10-07 ENCOUNTER — Other Ambulatory Visit (RURAL_HEALTH_CENTER): Payer: Self-pay | Admitting: Family

## 2022-10-07 ENCOUNTER — Ambulatory Visit (RURAL_HEALTH_CENTER): Payer: Self-pay | Admitting: Family

## 2022-10-07 LAB — HGA1C (HEMOGLOBIN A1C WITH EST AVG GLUCOSE): HEMOGLOBIN A1C: 5 % (ref 4.0–6.0)

## 2022-10-13 ENCOUNTER — Ambulatory Visit (RURAL_HEALTH_CENTER): Payer: Self-pay | Admitting: Family

## 2022-10-13 ENCOUNTER — Other Ambulatory Visit (RURAL_HEALTH_CENTER): Payer: Self-pay | Admitting: Family

## 2022-10-13 MED ORDER — FLUTICASONE FUROATE 100 MCG-VILANTEROL 25 MCG/DOSE INHALATION POWDER
1.0000 | DISK | Freq: Every day | RESPIRATORY_TRACT | 1 refills | Status: DC
Start: 2022-10-13 — End: 2023-01-16

## 2022-10-25 ENCOUNTER — Other Ambulatory Visit (RURAL_HEALTH_CENTER): Payer: Self-pay | Admitting: Family

## 2022-11-25 ENCOUNTER — Other Ambulatory Visit (RURAL_HEALTH_CENTER): Payer: Self-pay | Admitting: Family

## 2022-12-12 ENCOUNTER — Ambulatory Visit (RURAL_HEALTH_CENTER): Payer: Self-pay | Admitting: Family

## 2023-01-13 ENCOUNTER — Inpatient Hospital Stay
Admission: EM | Admit: 2023-01-13 | Discharge: 2023-01-16 | DRG: 558 | Disposition: A | Discharge status: Home / Self Care | Payer: Medicare Other

## 2023-01-13 ENCOUNTER — Emergency Department (HOSPITAL_COMMUNITY): Payer: Medicare Other

## 2023-01-13 ENCOUNTER — Other Ambulatory Visit: Payer: Self-pay

## 2023-01-13 ENCOUNTER — Encounter (HOSPITAL_COMMUNITY): Payer: Self-pay | Admitting: Physician Assistant

## 2023-01-13 DIAGNOSIS — Z7409 Other reduced mobility: Secondary | ICD-10-CM

## 2023-01-13 DIAGNOSIS — I4811 Longstanding persistent atrial fibrillation: Secondary | ICD-10-CM

## 2023-01-13 DIAGNOSIS — R270 Ataxia, unspecified: Secondary | ICD-10-CM

## 2023-01-13 DIAGNOSIS — M1812 Unilateral primary osteoarthritis of first carpometacarpal joint, left hand: Secondary | ICD-10-CM

## 2023-01-13 DIAGNOSIS — M545 Low back pain, unspecified: Secondary | ICD-10-CM | POA: Diagnosis present

## 2023-01-13 DIAGNOSIS — S0083XA Contusion of other part of head, initial encounter: Secondary | ICD-10-CM | POA: Diagnosis present

## 2023-01-13 DIAGNOSIS — M6282 Rhabdomyolysis: Principal | ICD-10-CM | POA: Diagnosis present

## 2023-01-13 DIAGNOSIS — R531 Weakness: Secondary | ICD-10-CM | POA: Diagnosis present

## 2023-01-13 DIAGNOSIS — Z87891 Personal history of nicotine dependence: Secondary | ICD-10-CM

## 2023-01-13 DIAGNOSIS — F0390 Unspecified dementia without behavioral disturbance: Secondary | ICD-10-CM | POA: Diagnosis present

## 2023-01-13 DIAGNOSIS — M5136 Other intervertebral disc degeneration, lumbar region: Secondary | ICD-10-CM

## 2023-01-13 DIAGNOSIS — N400 Enlarged prostate without lower urinary tract symptoms: Secondary | ICD-10-CM

## 2023-01-13 DIAGNOSIS — Z7951 Long term (current) use of inhaled steroids: Secondary | ICD-10-CM

## 2023-01-13 DIAGNOSIS — W19XXXA Unspecified fall, initial encounter: Secondary | ICD-10-CM | POA: Diagnosis present

## 2023-01-13 DIAGNOSIS — G8929 Other chronic pain: Secondary | ICD-10-CM | POA: Diagnosis present

## 2023-01-13 DIAGNOSIS — N401 Enlarged prostate with lower urinary tract symptoms: Secondary | ICD-10-CM | POA: Diagnosis present

## 2023-01-13 DIAGNOSIS — I5032 Chronic diastolic (congestive) heart failure: Secondary | ICD-10-CM | POA: Diagnosis present

## 2023-01-13 DIAGNOSIS — N183 Chronic kidney disease, stage 3 unspecified: Secondary | ICD-10-CM | POA: Diagnosis present

## 2023-01-13 DIAGNOSIS — Z79899 Other long term (current) drug therapy: Secondary | ICD-10-CM

## 2023-01-13 DIAGNOSIS — R310 Gross hematuria: Secondary | ICD-10-CM

## 2023-01-13 DIAGNOSIS — I129 Hypertensive chronic kidney disease with stage 1 through stage 4 chronic kidney disease, or unspecified chronic kidney disease: Secondary | ICD-10-CM | POA: Diagnosis present

## 2023-01-13 DIAGNOSIS — I4819 Other persistent atrial fibrillation: Secondary | ICD-10-CM | POA: Diagnosis present

## 2023-01-13 DIAGNOSIS — R339 Retention of urine, unspecified: Secondary | ICD-10-CM

## 2023-01-13 DIAGNOSIS — I130 Hypertensive heart and chronic kidney disease with heart failure and stage 1 through stage 4 chronic kidney disease, or unspecified chronic kidney disease: Secondary | ICD-10-CM | POA: Diagnosis present

## 2023-01-13 DIAGNOSIS — M5126 Other intervertebral disc displacement, lumbar region: Secondary | ICD-10-CM

## 2023-01-13 DIAGNOSIS — Z7901 Long term (current) use of anticoagulants: Secondary | ICD-10-CM

## 2023-01-13 DIAGNOSIS — R338 Other retention of urine: Secondary | ICD-10-CM | POA: Diagnosis present

## 2023-01-13 HISTORY — DX: Rhabdomyolysis: M62.82

## 2023-01-13 LAB — COMPREHENSIVE METABOLIC PANEL, NON-FASTING
ALBUMIN/GLOBULIN RATIO: 1.5 — ABNORMAL HIGH (ref 0.8–1.4)
ALBUMIN: 4.8 g/dL (ref 3.5–5.7)
ALKALINE PHOSPHATASE: 67 U/L (ref 34–104)
ALT (SGPT): 45 U/L (ref 7–52)
ANION GAP: 18 mmol/L — ABNORMAL HIGH (ref 4–13)
AST (SGOT): 107 U/L — ABNORMAL HIGH (ref 13–39)
BILIRUBIN TOTAL: 2.4 mg/dL — ABNORMAL HIGH (ref 0.3–1.0)
BUN/CREA RATIO: 23 — ABNORMAL HIGH (ref 6–22)
BUN: 23 mg/dL (ref 7–25)
CALCIUM, CORRECTED: 9 mg/dL (ref 8.9–10.8)
CALCIUM: 9.6 mg/dL (ref 8.6–10.3)
CHLORIDE: 108 mmol/L — ABNORMAL HIGH (ref 98–107)
CO2 TOTAL: 19 mmol/L — ABNORMAL LOW (ref 21–31)
CREATININE: 1.01 mg/dL (ref 0.60–1.30)
ESTIMATED GFR: 75 mL/min/{1.73_m2} (ref >59)
GLOBULIN: 3.2 (ref 2.9–5.4)
GLUCOSE: 119 mg/dL — ABNORMAL HIGH (ref 74–109)
OSMOLALITY, CALCULATED: 294 mOsm/kg — ABNORMAL HIGH (ref 270–290)
POTASSIUM: 3.6 mmol/L (ref 3.5–5.1)
PROTEIN TOTAL: 8 g/dL (ref 6.4–8.9)
SODIUM: 145 mmol/L (ref 136–145)

## 2023-01-13 LAB — CBC WITH DIFF
BASOPHIL #: 0 10*3/uL (ref 0.00–0.10)
BASOPHIL %: 0 % (ref 0–1)
EOSINOPHIL #: 0 10*3/uL (ref 0.00–0.50)
EOSINOPHIL %: 0 % — ABNORMAL LOW (ref 1–8)
HCT: 46.2 % (ref 36.7–47.1)
HGB: 15.4 g/dL (ref 12.5–16.3)
LYMPHOCYTE #: 0.9 10*3/uL — ABNORMAL LOW (ref 1.00–3.00)
LYMPHOCYTE %: 12 % — ABNORMAL LOW (ref 16–44)
MCH: 33.7 pg — ABNORMAL HIGH (ref 23.8–33.4)
MCHC: 33.3 g/dL (ref 32.5–36.3)
MCV: 101.3 fL — ABNORMAL HIGH (ref 73.0–96.2)
MONOCYTE #: 0.3 10*3/uL (ref 0.30–1.00)
MONOCYTE %: 4 % — ABNORMAL LOW (ref 5–13)
MPV: 8.2 fL (ref 7.4–11.4)
NEUTROPHIL #: 6.7 10*3/uL (ref 1.85–7.80)
NEUTROPHIL %: 84 % — ABNORMAL HIGH (ref 43–77)
PLATELETS: 106 10*3/uL — ABNORMAL LOW (ref 140–440)
RBC: 4.56 10*6/uL (ref 4.06–5.63)
RDW: 14.1 % (ref 12.1–16.2)
WBC: 8 10*3/uL (ref 3.6–10.2)

## 2023-01-13 LAB — ARTERIAL BLOOD GAS/LACTATE
%FIO2 (ARTERIAL): 21 %
BASE DEFICIT: 7.8 mmol/L — ABNORMAL HIGH (ref 0.0–2.0)
BICARBONATE (ARTERIAL): 19.3 mmol/L — ABNORMAL LOW (ref 20.0–26.0)
CARBOXYHEMOGLOBIN: 1.6 % — ABNORMAL HIGH (ref <=1.5)
MET-HEMOGLOBIN: 0.1 % (ref <=2.0)
O2CT: 19.9 %
OXYHEMOGLOBIN: 95.4 % (ref 88.0–100.0)
PCO2 (ARTERIAL): 29 mm/Hg — ABNORMAL LOW (ref 35–45)
PH (ARTERIAL): 7.38 (ref 7.35–7.45)
PO2 (ARTERIAL): 84 mm/Hg (ref 80–100)

## 2023-01-13 LAB — URINALYSIS, MACROSCOPIC
BILIRUBIN: NEGATIVE mg/dL
BLOOD: NEGATIVE mg/dL
GLUCOSE: 50 mg/dL — AB
KETONES: 10 mg/dL — AB
LEUKOCYTES: NEGATIVE WBCs/uL
NITRITE: NEGATIVE
PH: 5 (ref 5.0–9.0)
PROTEIN: 30 mg/dL — AB
SPECIFIC GRAVITY: >1.03 — ABNORMAL HIGH (ref 1.002–1.030)
UROBILINOGEN: NORMAL mg/dL

## 2023-01-13 LAB — LACTIC ACID - FIRST REFLEX: LACTIC ACID: 1.5 mmol/L (ref 0.5–2.2)

## 2023-01-13 LAB — C-REACTIVE PROTEIN (CRP): C-REACTIVE PROTEIN (CRP): 6.8 mg/dL — ABNORMAL HIGH (ref 0.1–0.5)

## 2023-01-13 LAB — COVID-19, FLU A/B, RSV RAPID BY PCR
INFLUENZA VIRUS TYPE A: NOT DETECTED
INFLUENZA VIRUS TYPE B: NOT DETECTED
RESPIRATORY SYNCTIAL VIRUS (RSV): NOT DETECTED
SARS-CoV-2: NOT DETECTED

## 2023-01-13 LAB — CREATINE KINASE (CK), TOTAL, SERUM OR PLASMA: CREATINE KINASE: 3862 U/L — ABNORMAL HIGH (ref 30–223)

## 2023-01-13 LAB — TROPONIN-I
TROPONIN I: 59 ng/L — ABNORMAL HIGH (ref <20)
TROPONIN I: 67 ng/L — ABNORMAL HIGH (ref <20)

## 2023-01-13 LAB — ETHANOL, SERUM/PLASMA: ETHANOL: <10 mg/dL

## 2023-01-13 LAB — LACTIC ACID LEVEL W/ REFLEX FOR LEVEL >2.0: LACTIC ACID: 3.7 mmol/L — ABNORMAL HIGH (ref 0.5–2.2)

## 2023-01-13 LAB — PT/INR
INR: 1.12 — ABNORMAL HIGH (ref 0.84–1.10)
PROTHROMBIN TIME: 13.1 seconds — ABNORMAL HIGH (ref 9.8–12.7)

## 2023-01-13 LAB — URINALYSIS, MICROSCOPIC
RBCS: 1 /hpf (ref <4)
SQUAMOUS EPITHELIAL: 1 /hpf (ref <28)
WBCS: 0 /hpf (ref <6)

## 2023-01-13 LAB — PTT (PARTIAL THROMBOPLASTIN TIME): APTT: 34 seconds (ref 25.0–38.0)

## 2023-01-13 MED ORDER — SODIUM CHLORIDE 0.9 % (FLUSH) INJECTION SYRINGE
3.0000 mL | INJECTION | Freq: Three times a day (TID) | INTRAMUSCULAR | Status: DC
Start: 2023-01-13 — End: 2023-01-16
  Administered 2023-01-13 – 2023-01-14 (×2): 3 mL
  Administered 2023-01-14: 0 mL
  Administered 2023-01-14 – 2023-01-15 (×3): 3 mL
  Administered 2023-01-15 – 2023-01-16 (×3): 0 mL

## 2023-01-13 MED ORDER — SODIUM CHLORIDE 0.9 % IV BOLUS
1000.0000 mL | INJECTION | Status: AC
Start: 2023-01-13 — End: 2023-01-13
  Administered 2023-01-13: 1000 mL via INTRAVENOUS
  Administered 2023-01-13: 0 mL via INTRAVENOUS

## 2023-01-13 MED ORDER — SODIUM CHLORIDE 0.9 % (FLUSH) INJECTION SYRINGE
3.0000 mL | INJECTION | INTRAMUSCULAR | Status: DC | PRN
Start: 2023-01-13 — End: 2023-01-16

## 2023-01-13 MED ORDER — ONDANSETRON HCL (PF) 4 MG/2 ML INJECTION SOLUTION
4.0000 mg | Freq: Four times a day (QID) | INTRAMUSCULAR | Status: DC | PRN
Start: 2023-01-13 — End: 2023-01-16

## 2023-01-13 MED ORDER — SODIUM CHLORIDE 0.9 % INTRAVENOUS SOLUTION
INTRAVENOUS | Status: DC
Start: 2023-01-13 — End: 2023-01-16

## 2023-01-13 MED ORDER — IPRATROPIUM 0.5 MG-ALBUTEROL 3 MG (2.5 MG BASE)/3 ML NEBULIZATION SOLN
3.0000 mL | INHALATION_SOLUTION | RESPIRATORY_TRACT | Status: DC | PRN
Start: 2023-01-13 — End: 2023-01-16

## 2023-01-13 MED ORDER — ACETAMINOPHEN 325 MG TABLET
650.0000 mg | ORAL_TABLET | ORAL | Status: DC | PRN
Start: 2023-01-13 — End: 2023-01-16

## 2023-01-13 NOTE — ED Provider Notes (Signed)
[facility]  ED Primary Provider Note  History of Present Illness   Chief Complaint   Patient presents with    Jesse Cooper. is a 82 y.o. male who had concerns including Fall.  Arrival: The patient arrived by Ambulance    Patient was an 82 year old male past no history of atrial fibrillation chronic kidney disease mild dementia heart failure presents emergency department after being found on the ground.  Family states last time they saw him was 2 days ago.  Son states that he went to check on him today and he found him lying face down.  Patient does not remember how this happened.  He was alert and answers questions does not remember the episode.  Believes he was on the ground for a couple of hours.  He does have an abrasion of the right face.  He denies any somatic complaints or recent illness denies chest pain shortness of breath fevers chills nausea vomiting.  Has chronic low back pain has complaints of lower back pain.  Patient is anticoagulated on Eliquis      History Reviewed This Encounter: Medical History  Surgical History  Family History  Social History    Physical Exam   ED Triage Vitals [01/13/23 1825]   BP (Non-Invasive) (!) 190/90   Heart Rate 88   Respiratory Rate 18   Temperature 37.9 C (100.2 F)   SpO2 96 %   Weight 80.7 kg (178 lb)   Height 1.778 m (5\' 10" )     Physical Exam  Constitutional:       Appearance: Normal appearance.   HENT:      Head: Normocephalic.      Comments: Abrasion over right zygomatic process no crepitus     Nose: Nose normal.      Mouth/Throat:      Mouth: Mucous membranes are moist.   Eyes:      Extraocular Movements: Extraocular movements intact.      Pupils: Pupils are equal, round, and reactive to light.   Cardiovascular:      Rate and Rhythm: Normal rate and regular rhythm.      Pulses: Normal pulses.      Heart sounds: Normal heart sounds.   Pulmonary:      Effort: Pulmonary effort is normal.      Breath sounds: Normal  breath sounds.   Abdominal:      General: Abdomen is flat.      Palpations: Abdomen is soft.   Musculoskeletal:         General: Normal range of motion.      Cervical back: Normal range of motion and neck supple.      Comments: Some pain in the lower back with left straight leg raise   Neurological:      Mental Status: He is alert.       Patient Data     Labs Ordered/Reviewed   COMPREHENSIVE METABOLIC PANEL, NON-FASTING - Abnormal; Notable for the following components:       Result Value    CHLORIDE 108 (*)     CO2 TOTAL 19 (*)     ANION GAP 18 (*)     BUN/CREA RATIO 23 (*)     GLUCOSE 119 (*)     AST (SGOT) 107 (*)     BILIRUBIN TOTAL 2.4 (*)     ALBUMIN/GLOBULIN RATIO 1.5 (*)     OSMOLALITY, CALCULATED 294 (*)  All other components within normal limits    Narrative:     Estimated Glomerular Filtration Rate (eGFR) is calculated using the CKD-EPI (2021) equation, intended for patients 58 years of age and older. If gender is not documented or "unknown", there will be no eGFR calculation.     CREATINE KINASE (CK), TOTAL, SERUM OR PLASMA - Abnormal; Notable for the following components:    CREATINE KINASE 3,862 (*)     All other components within normal limits   C-REACTIVE PROTEIN (CRP) - Abnormal; Notable for the following components:    C-REACTIVE PROTEIN (CRP) 6.8 (*)     All other components within normal limits   LACTIC ACID LEVEL W/ REFLEX FOR LEVEL >2.0 - Abnormal; Notable for the following components:    LACTIC ACID 3.7 (*)     All other components within normal limits   PT/INR - Abnormal; Notable for the following components:    PROTHROMBIN TIME 13.1 (*)     INR 1.12 (*)     All other components within normal limits    Narrative:     INR OF 2.0-3.0  RECOMMENDED FOR: PROPHYLAXIS/TREATMENT OF VENEOUS THROMBOSIS, PULMONARY EMBOLISM, PREVENTION OF SYSTEMIC EMBOLISM FROM ATRIAL FIBRILATION, MYOCARDIAL INFARCTION.    INR OF 2.5-3.5  RECOMMENDED FOR MECHANICAL PROSTHETIC HEART VALVES, RECURRENT SYSTEMIC EMBOLISM,  RECURRENT MYOCARDIAL INFARCTION.     TROPONIN-I - Abnormal; Notable for the following components:    TROPONIN I 59 (*)     All other components within normal limits   CBC WITH DIFF - Abnormal; Notable for the following components:    MCV 101.3 (*)     MCH 33.7 (*)     PLATELETS 106 (*)     NEUTROPHIL % 84 (*)     LYMPHOCYTE % 12 (*)     MONOCYTE % 4 (*)     EOSINOPHIL % 0 (*)     LYMPHOCYTE # 0.90 (*)     All other components within normal limits   URINALYSIS, MACROSCOPIC - Abnormal; Notable for the following components:    SPECIFIC GRAVITY >1.030 (*)     PROTEIN 30 (*)     GLUCOSE 50 (*)     KETONES 10 (*)     All other components within normal limits   ARTERIAL BLOOD GAS/LACTATE - Abnormal; Notable for the following components:    PCO2 (ARTERIAL) 29 (*)     BICARBONATE (ARTERIAL) 19.3 (*)     CARBOXYHEMOGLOBIN 1.6 (*)     BASE DEFICIT 7.8 (*)     All other components within normal limits    Narrative:     .  Marland Kitchen   PTT (PARTIAL THROMBOPLASTIN TIME) - Normal   URINALYSIS, MICROSCOPIC - Normal   COVID-19, FLU A/B, RSV RAPID BY PCR - Normal    Narrative:     Results are for the simultaneous qualitative identification of SARS-CoV-2 (formerly 2019-nCoV), Influenza A, Influenza B, and RSV RNA. These etiologic agents are generally detectable in nasopharyngeal and nasal swabs during the ACUTE PHASE of infection. Hence, this test is intended to be performed on respiratory specimens collected from individuals with signs and symptoms of upper respiratory tract infection who meet Centers for Disease Control and Prevention (CDC) clinical and/or epidemiological criteria for Coronavirus Disease 2019 (COVID-19) testing. CDC COVID-19 criteria for testing on human specimens is available at [facility] webpage information for Healthcare Professionals: Coronavirus Disease 2019 (COVID-19) ([employer]).     False-negative results may occur if the virus has genomic mutations, insertions,  deletions,  or rearrangements or if performed very early in the course of illness. Otherwise, negative results indicate virus specific RNA targets are not detected, however negative results do not preclude SARS-CoV-2 infection/COVID-19, Influenza, or Respiratory syncytial virus infection. Results should not be used as the sole basis for patient management decisions. Negative results must be combined with clinical observations, patient history, and epidemiological information. If upper respiratory tract infection is still suspected based on exposure history together with other clinical findings, re-testing should be considered.    Test methodology:   Cepheid Xpert Xpress SARS-CoV-2/Flu/RSV Assay real-time polymerase chain reaction (RT-PCR) test on the GeneXpert Dx and Xpert Xpress systems.   ETHANOL, SERUM/PLASMA - Normal   ADULT ROUTINE BLOOD CULTURE, SET OF 2 BOTTLES (BACTERIA AND YEAST)   ADULT ROUTINE BLOOD CULTURE, SET OF 2 BOTTLES (BACTERIA AND YEAST)   CBC/DIFF    Narrative:     The following orders were created for panel order CBC/DIFF.  Procedure                               Abnormality         Status                     ---------                               -----------         ------                     CBC WITH DGUY[[id number]]                Abnormal            Final result                 Please view results for these tests on the individual orders.   URINALYSIS, MACROSCOPIC AND MICROSCOPIC W/CULTURE REFLEX    Narrative:     The following orders were created for panel order URINALYSIS, MACROSCOPIC AND MICROSCOPIC W/CULTURE REFLEX [PRN ONLY].  Procedure                               Abnormality         Status                     ---------                               -----------         ------                     URINALYSIS, MACROSCOPIC[[id number]]      Abnormal            Final result               URINALYSIS, MICROSCOPIC[[id number]]      Normal              Final result                 Please view results for  these tests on the individual orders.   EXTRA TUBES    Narrative:     The following orders  were created for panel order EXTRA TUBES.  Procedure                               Abnormality         Status                     ---------                               -----------         ------                     GOLD TOP WUXL[[id number]]                                    In process                   Please view results for these tests on the individual orders.   GOLD TOP TUBE   LACTIC ACID - FIRST REFLEX   TROPONIN-I     CT BRAIN WO IV CONTRAST   Final Result by Edi, Radresults In (08/16 1959)   CHRONIC CHANGES.  NO ACUTE FINDINGS.          One or more dose reduction techniques were used (e.g., Automated exposure control, adjustment of the mA and/or kV according to patient size, use of iterative reconstruction technique).         Radiologist location ID: [id number]         CT CERVICAL SPINE WO IV CONTRAST   Final Result by Edi, Radresults In (08/16 2003)   NO ACUTE CERVICAL FRACTURE.  DEGENERATIVE CHANGES.      SOLID NODULES - SINGLE   LOW RISK   <50mm:  No routine follow-up*   6-72mm:  CT at 6'12 months, then consider CT at 18'24 months   >34mm:  Consider CT at 3 months, PET/CT, or tissue sampling   HIGH RISK   <70mm:  Optional CT at 12 months*   6-3mm:  CT at 6'12 months, then CT at 18'24 months   >17mm:  Consider CT at 3 months, PET/CT, or tissue sampling   * Certain patients at high risk with suspicious nodule morphology, upper lobe location, or both may warrant 13-month follow-up   (Guidelines for Management of Incidental Pulmonary Nodules Detected on CT Images: From the Fleischner Society 2017)               One or more dose reduction techniques were used (e.g., Automated exposure control, adjustment of the mA and/or kV according to patient size, use of iterative reconstruction technique).         Radiologist location ID: [id number]         XR LUMBAR SPINE AP AND LAT   Final Result by Edi, Radresults In (08/16 1943)    ADVANCED DEGENERATIVE CHANGES OF THE LUMBAR SPINE.               Radiologist location ID: [id number]           Medical Decision Making        Medical Decision Making  CT of head and cervical spine did not demonstrate any acute process plain films of lumbar spine demonstrate degenerative changes with no acute process.  CBC unremarkable metabolic panel demonstrates some acidosis with elevated anion gap mildly elevated bilirubin and AST alcohol less than 10.  Patient's lactic 3.8 CPK approximately 4000.  Suspect this is likely secondary to decreased p.o. intake.  Patient does have dementia lives alone.  Fluid bolus ordered.  Urinalysis does not demonstrate infection.  COVID flu RSV negative.  Discussed case with hospital team agreed evaluate patient for admission    Amount and/or Complexity of Data Reviewed  Labs: ordered.  Radiology: ordered.  ECG/medicine tests: ordered.    Risk  Prescription drug management.                Medications Administered in the ED   NS flush syringe (3 mL Intracatheter Given 01/13/23 2035)   NS flush syringe (has no administration in time range)   NS bolus infusion 1,000 mL (1,000 mL Intravenous New Bag/New Syringe 01/13/23 2035)     Clinical Impression   Rhabdomyolysis (Primary)       Disposition: Admitted

## 2023-01-13 NOTE — H&P (Signed)
[place] MEDICINE [facility]    HOSPITALIST H&P    Lincoln Christiansen. 82 y.o. male ED12/ED12   Date of Service: 01/13/2023    Date of Admission:  01/13/2023   PCP: Stormy Fabian, NP-C Code Status:No Order       Chief Complaint:  Fall    HPI:     82 year old white male was brought to the emergency room for evaluation after a fall.  He lives alone  Patient has dementia but currently is alert oriented x3 awake and appropriate however he does not recall falling  His son found him face down in the floor  Last time they saw him was 2 days ago so no one really knows how long he was in the floor  He denies any headache visual problems slurred speech or focal weakness  Denies any chest neck arm back or jaw pain  Denies palpitations or dizziness  There was no incontinence of bowel or bladder control  No reported seizure activity  He is on Eliquis for chronic atrial fibrillation  He denies any complaints of pain related to the fall    ASSESSMENT/PLAN:    ** FALL  ** FACIAL CONTUSIONS  ** RHABDOMYOLYSIS  ** GENERALIZED WEAKNESS  -dementia  -chronic persistent atrial 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  Home meds as appropriate once med rec updated  All future and further care per attending physician        BMP:   145 (08/16 1925) 108* (08/16 1925) 23 (08/16 1925)    /     119* (08/16 1925)   3.6 (08/16 1925) 19* (08/16 1925) 1.01 (08/16 1925) \                CBC:     8.0 (08/16 1925) \   15.4 (08/16 1925) /   106* (08/16 1925)      / 46.2 (08/16 1925) \      TOTAL CK 3862    CT HEAD NEGATIVE ACUTE    CT CERVICAL SPINE NEGATIVE ACUTE    X-RAY LUMBAR SPINE NEGATIVE ACUTE   ADVANCED DEGENERATIVE CHANGES               ROS:   As above otherwise all other systems negative/noncontributory  Physical:  Filed Vitals:    01/13/23 1930 01/13/23 1945 01/13/23 2000 01/13/23 2015   BP:    (!) 161/90   Pulse: 99 86 77 79   Resp: 18 (!) 21 (!) 25 (!) 24   Temp:        SpO2: 99% 98% 100% 99%      General:  Well-developed well-nourished elderly white male no acute distress  Head: Normocephalic and atraumatic.  Pupils equal sclerae nonicteric membranes moist.  There is some ecchymosis to the right side of the face no underlying facial or skull bone deformities noted no blood or fluid noted from the nose or ears negative for Coon's eyes or battle signs  Neck: Supple.   Heart:  irregular rate and rhythm  Lungs: Clear to auscultation bilaterally  Abdomen: Soft, nontender, nondistended no guarding or rebound no organomegaly or palpable masses femoral pulses equal as are distal pulses  Extremities:  Negative for clubbing cyanosis or edema    Skin: Warm and dry without lesions.  Negative for rash jaundice  Neurologic:  Alert oriented x3 no gross deficit  Psychiatric:  Normal affect, quite pleasant  ED medications:   Medications Administered in the ED   NS flush syringe (3 mL Intracatheter Given 01/13/23 2035)   NS flush syringe (has no administration in time range)   NS bolus infusion 1,000 mL (1,000 mL Intravenous New Bag/New Syringe 01/13/23 2035)         PMHx:    Past Medical History:   Diagnosis Date    Pleural effusion associated with hepatic disorder     Dr. Felton Clinton, Pulmonologist.    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    US THORACENTESIS - Approximately 1300 mL of strawcolored fluid was aspirated (Imaging)    Prostate cancer screening     PSHx:   Past Surgical History:   Procedure Laterality Date    COLONSCOPY BEDSIDE N/A 2012    [place] - no polyps    HAND SURGERY Left     Charise Killian    [facility] SURGERY N/A 2011    2013 [place]       Allergies:    Allergies   Allergen Reactions    Hydrocodone-Homatropine Itching    Social History  Social History     Tobacco Use    Smoking status: Former     Current packs/day: 0.00     Average packs/day: 1 pack/day for 13.0 years (13.0 ttl pk-yrs)     Types: Cigarettes     Start date: 67     Quit  date: 60     Years since quitting: 49.6    Smokeless tobacco: Never    Tobacco comments:     Quit chewing tobacco in 1988   Substance Use Topics    Alcohol use: Not Currently     Alcohol/week: 8.0 standard drinks of alcohol     Types: 1 Cans of beer, 7 Standard drinks or equivalent per week     Comment: 1 beer with dinner    Drug use: Never       Family History  Family Medical History:       Problem Relation (Age of Onset)    Dementia Mother    Elevated Lipids Brother    Lung Cancer Father               Home Meds:      Prior to Admission medications    Medication Sig Start Date End Date Taking? Authorizing Provider   albuterol sulfate (PROVENTIL OR VENTOLIN OR PROAIR) 90 mcg/actuation Inhalation oral inhaler Take 2 Puffs by inhalation Every 6 hours as needed 08/19/21   Lamont Dowdy L, NP-C   ampicillin (PRINCIPEN) 500 mg Oral Capsule TAKE ONE CAPSULE BY MOUTH ONCE EVERY DAY 11/28/22   Lamont Dowdy L, NP-C   carvediloL (COREG) 25 mg Oral Tablet Take 1 Tablet (25 mg total) by mouth Twice daily 07/01/21   Provider, Historical   cholecalciferol, vitamin D3, 1,250 mcg (50,000 unit) Oral Capsule TAKE ONE CAPSULE BY MOUTH ONCE EVERY SEVEN DAYS 09/20/22   Stormy Fabian, NP-C   ELIQUIS 5 mg Oral Tablet Take 1 Tablet (5 mg total) by mouth Twice daily 07/01/21   Provider, Historical   fluticasone furoate-vilanteroL (BREO ELLIPTA) 100-25 mcg/dose Inhalation Disk with Device Take 1 Inhalation by inhalation Once a day for 90 days 10/13/22 01/11/23  Stormy Fabian, NP-C   JARDIANCE 10 mg Oral Tablet Take 1 Tablet (10 mg total) by mouth Once a day 09/19/22   Provider, Historical   losartan (COZAAR) 100 mg Oral Tablet Take 1  Tablet (100 mg total) by mouth Once a day 08/22/22   Lamont Dowdy L, NP-C   Magnesium 250 mg Oral Tablet Take 1 Tablet (250 mg total) by mouth Once a day    Provider, Historical   memantine (NAMENDA) 10 mg Oral Tablet TAKE 1/2 TABLET BY MOUTH ONCE EVERY DAY at bedtime FOR 7 DAYS THEN TAKE 1/2 TABLET TWICE DAILY FOR  7 DAYS THEN TAKE 1/2 TABLET EVERY MORNING AND TAKE ONE TABLET AT BEDTIME FOR 7 DAYS THEN TAKE ONE TABLET TWICE DAILY THEREAFTER 09/19/22   Provider, Historical   multivitamin (SUPER THERA VITE M) Oral Tablet Take 1 Tablet by mouth    Provider, Historical   rosuvastatin (CRESTOR) 20 mg Oral Tablet Take 1 Tablet (20 mg total) by mouth Once a day 06/30/22   Stormy Fabian, NP-C        Results for orders placed or performed during the hospital encounter of 01/13/23 (from the past 24 hour(s))   ECG 12 LEAD   Result Value Ref Range    Ventricular rate 86 BPM    QRS Duration 118 ms    QT Interval 400 ms    QTC Calculation 478 ms    Calculated R Axis -83 degrees    Calculated T Axis 91 degrees   COVID-19, FLU A/B, RSV RAPID BY PCR   Result Value Ref Range    SARS-CoV-2 Not Detected Not Detected    INFLUENZA VIRUS TYPE A Not Detected Not Detected    INFLUENZA VIRUS TYPE B Not Detected Not Detected    RESPIRATORY SYNCTIAL VIRUS (RSV) Not Detected Not Detected   COMPREHENSIVE METABOLIC PANEL, NON-FASTING   Result Value Ref Range    SODIUM 145 136 - 145 mmol/L    POTASSIUM 3.6 3.5 - 5.1 mmol/L    CHLORIDE 108 (H) 98 - 107 mmol/L    CO2 TOTAL 19 (L) 21 - 31 mmol/L    ANION GAP 18 (H) 4 - 13 mmol/L    BUN 23 7 - 25 mg/dL    CREATININE 0.10 9.32 - 1.30 mg/dL    BUN/CREA RATIO 23 (H) 6 - 22    ESTIMATED GFR 75 >59 mL/min/1.74m^2    ALBUMIN 4.8 3.5 - 5.7 g/dL    CALCIUM 9.6 8.6 - 35.5 mg/dL    GLUCOSE 732 (H) 74 - 109 mg/dL    ALKALINE PHOSPHATASE 67 34 - 104 U/L    ALT (SGPT) 45 7 - 52 U/L    AST (SGOT) 107 (H) 13 - 39 U/L    BILIRUBIN TOTAL 2.4 (H) 0.3 - 1.0 mg/dL    PROTEIN TOTAL 8.0 6.4 - 8.9 g/dL    ALBUMIN/GLOBULIN RATIO 1.5 (H) 0.8 - 1.4    OSMOLALITY, CALCULATED 294 (H) 270 - 290 mOsm/kg    CALCIUM, CORRECTED 9.0 8.9 - 10.8 mg/dL    GLOBULIN 3.2 2.9 - 5.4   CREATINE KINASE (CK), TOTAL, SERUM OR PLASMA   Result Value Ref Range    CREATINE KINASE 3,862 (H) 30 - 223 U/L   C-REACTIVE PROTEIN (CRP)   Result Value Ref Range     C-REACTIVE PROTEIN (CRP) 6.8 (H) 0.1 - 0.5 mg/dL   LACTIC ACID LEVEL W/ REFLEX FOR LEVEL >2.0   Result Value Ref Range    LACTIC ACID 3.7 (H) 0.5 - 2.2 mmol/L   PT/INR   Result Value Ref Range    PROTHROMBIN TIME 13.1 (H) 9.8 - 12.7 seconds    INR 1.12 (H) 0.84 - 1.10  PTT (PARTIAL THROMBOPLASTIN TIME)   Result Value Ref Range    APTT 34.0 25.0 - 38.0 seconds   TROPONIN-I   Result Value Ref Range    TROPONIN I 59 (H) <20 ng/L   CBC WITH DIFF   Result Value Ref Range    WBC 8.0 3.6 - 10.2 x10^3/uL    RBC 4.56 4.06 - 5.63 x10^6/uL    HGB 15.4 12.5 - 16.3 g/dL    HCT 84.6 96.2 - 95.2 %    MCV 101.3 (H) 73.0 - 96.2 fL    MCH 33.7 (H) 23.8 - 33.4 pg    MCHC 33.3 32.5 - 36.3 g/dL    RDW 84.1 32.4 - 40.1 %    PLATELETS 106 (L) 140 - 440 x10^3/uL    MPV 8.2 7.4 - 11.4 fL    NEUTROPHIL % 84 (H) 43 - 77 %    LYMPHOCYTE % 12 (L) 16 - 44 %    MONOCYTE % 4 (L) 5 - 13 %    EOSINOPHIL % 0 (L) 1 - 8 %    BASOPHIL % 0 0 - 1 %    NEUTROPHIL # 6.70 1.85 - 7.80 x10^3/uL    LYMPHOCYTE # 0.90 (L) 1.00 - 3.00 x10^3/uL    MONOCYTE # 0.30 0.30 - 1.00 x10^3/uL    EOSINOPHIL # 0.00 0.00 - 0.50 x10^3/uL    BASOPHIL # 0.00 0.00 - 0.10 x10^3/uL   ETHANOL, SERUM   Result Value Ref Range    ETHANOL <10 0 mg/dL   URINALYSIS, MACROSCOPIC   Result Value Ref Range    COLOR Yellow Colorless, Light Yellow, Yellow    APPEARANCE Clear Clear    SPECIFIC GRAVITY >1.030 (H) 1.002 - 1.030    PH 5.0 5.0 - 9.0    LEUKOCYTES Negative Negative, 100  WBCs/uL    NITRITE Negative Negative    PROTEIN 30 (A) Negative, 10 , 20  mg/dL    GLUCOSE 50 (A) Negative, 30  mg/dL    KETONES 10 (A) Negative, Trace mg/dL    BILIRUBIN Negative Negative, 0.5 mg/dL    BLOOD Negative Negative mg/dL    UROBILINOGEN Normal Normal mg/dL   URINALYSIS, MICROSCOPIC   Result Value Ref Range    RBCS 1 <4 /hpf    WBCS 0 <6 /hpf    SQUAMOUS EPITHELIAL 1 <28 /hpf   ARTERIAL BLOOD GAS/LACTATE   Result Value Ref Range    PH (ARTERIAL) 7.38 7.35 - 7.45    PCO2 (ARTERIAL) 29 (L) 35 - 45 mm/Hg     BICARBONATE (ARTERIAL) 19.3 (L) 20.0 - 26.0 mmol/L    MET-HEMOGLOBIN 0.1 <=2.0 %    LACTATE      CARBOXYHEMOGLOBIN 1.6 (H) <=1.5 %    O2CT 19.9 %    %FIO2 (ARTERIAL) 21 %    PO2 (ARTERIAL) 84 80 - 100 mm/Hg    OXYHEMOGLOBIN 95.4 88.0 - 100.0 %    ALLEN TEST passed     DRAW SITE RT RAD     BASE DEFICIT 7.8 (H) 0.0 - 2.0 mmol/L         Diagnostic studies:  No results found.       EKG interpretation:     @PEVF @    Assessment:  There are no active hospital problems to display for this patient.        Stana Bunting MD

## 2023-01-13 NOTE — ED Nurses Note (Signed)
Report called to 3W. Pt is currently saline locked.

## 2023-01-13 NOTE — ED Triage Notes (Signed)
PT REPORTS FALLING A COUPLE OF HOURS AGO. PT FAMILY FOUND PT PRONE ON FLOOR. PT HAS HX OF DEMENTIA. FAMILY REPORTS INCREASED CONFUSION.

## 2023-01-14 DIAGNOSIS — Z602 Problems related to living alone: Secondary | ICD-10-CM

## 2023-01-14 DIAGNOSIS — R531 Weakness: Secondary | ICD-10-CM

## 2023-01-14 DIAGNOSIS — I4819 Other persistent atrial fibrillation: Secondary | ICD-10-CM

## 2023-01-14 DIAGNOSIS — Z7901 Long term (current) use of anticoagulants: Secondary | ICD-10-CM

## 2023-01-14 DIAGNOSIS — S0083XA Contusion of other part of head, initial encounter: Secondary | ICD-10-CM

## 2023-01-14 LAB — CBC WITH DIFF
BASOPHIL #: 0 10*3/uL (ref 0.00–0.10)
BASOPHIL %: 0 % (ref 0–1)
EOSINOPHIL #: 0 10*3/uL (ref 0.00–0.50)
EOSINOPHIL %: 0 % — ABNORMAL LOW (ref 1–8)
HCT: 40 % (ref 36.7–47.1)
HGB: 13.6 g/dL (ref 12.5–16.3)
LYMPHOCYTE #: 1.1 10*3/uL (ref 1.00–3.00)
LYMPHOCYTE %: 16 % (ref 16–44)
MCH: 34.2 pg — ABNORMAL HIGH (ref 23.8–33.4)
MCHC: 33.9 g/dL (ref 32.5–36.3)
MCV: 101 fL — ABNORMAL HIGH (ref 73.0–96.2)
MONOCYTE #: 0.5 10*3/uL (ref 0.30–1.00)
MONOCYTE %: 7 % (ref 5–13)
MPV: 8.2 fL (ref 7.4–11.4)
NEUTROPHIL #: 5.2 10*3/uL (ref 1.85–7.80)
NEUTROPHIL %: 77 % (ref 43–77)
PLATELETS: 98 10*3/uL — ABNORMAL LOW (ref 140–440)
RBC: 3.96 10*6/uL — ABNORMAL LOW (ref 4.06–5.63)
RDW: 14 % (ref 12.1–16.2)
WBC: 6.7 10*3/uL (ref 3.6–10.2)

## 2023-01-14 LAB — BASIC METABOLIC PANEL
ANION GAP: 14 mmol/L — ABNORMAL HIGH (ref 4–13)
BUN/CREA RATIO: 27 — ABNORMAL HIGH (ref 6–22)
BUN: 25 mg/dL (ref 7–25)
CALCIUM: 8.5 mg/dL — ABNORMAL LOW (ref 8.6–10.3)
CHLORIDE: 110 mmol/L — ABNORMAL HIGH (ref 98–107)
CO2 TOTAL: 19 mmol/L — ABNORMAL LOW (ref 21–31)
CREATININE: 0.92 mg/dL (ref 0.60–1.30)
ESTIMATED GFR: 84 mL/min/{1.73_m2} (ref >59)
GLUCOSE: 112 mg/dL — ABNORMAL HIGH (ref 74–109)
OSMOLALITY, CALCULATED: 290 mOsm/kg (ref 270–290)
POTASSIUM: 3.5 mmol/L (ref 3.5–5.1)
SODIUM: 143 mmol/L (ref 136–145)

## 2023-01-14 LAB — MAGNESIUM: MAGNESIUM: 2.3 mg/dL (ref 1.9–2.7)

## 2023-01-14 LAB — GOLD TOP TUBE

## 2023-01-14 LAB — SCAN DIFFERENTIAL: PLATELET MORPHOLOGY COMMENT: DECREASED

## 2023-01-14 LAB — CREATINE KINASE (CK), TOTAL, SERUM OR PLASMA: CREATINE KINASE: 4899 U/L — ABNORMAL HIGH (ref 30–223)

## 2023-01-14 MED ORDER — MEMANTINE 10 MG TABLET
10.0000 mg | ORAL_TABLET | Freq: Every day | ORAL | Status: DC
Start: 2023-01-14 — End: 2023-01-16
  Administered 2023-01-14 – 2023-01-16 (×3): 10 mg via ORAL
  Filled 2023-01-14 (×3): qty 1

## 2023-01-14 MED ORDER — CARVEDILOL 12.5 MG TABLET
12.5000 mg | ORAL_TABLET | Freq: Two times a day (BID) | ORAL | Status: DC
Start: 2023-01-14 — End: 2023-01-16
  Administered 2023-01-14 – 2023-01-16 (×5): 12.5 mg via ORAL
  Filled 2023-01-14 (×5): qty 1

## 2023-01-14 MED ORDER — APIXABAN 5 MG TABLET
2.5000 mg | ORAL_TABLET | Freq: Two times a day (BID) | ORAL | Status: DC
Start: 2023-01-14 — End: 2023-01-16
  Administered 2023-01-14 – 2023-01-15 (×3): 2.5 mg via ORAL
  Filled 2023-01-14 (×3): qty 1

## 2023-01-14 MED ORDER — DONEPEZIL 5 MG TABLET
5.0000 mg | ORAL_TABLET | Freq: Every evening | ORAL | Status: DC
Start: 2023-01-14 — End: 2023-01-16
  Administered 2023-01-14 – 2023-01-15 (×2): 5 mg via ORAL
  Filled 2023-01-14 (×2): qty 1

## 2023-01-14 NOTE — Care Plan (Signed)
Patient admitted with Rhabdomyolysis. Patient resting in bed with call bell in reach. Fall prevention protocol in place, bed alarm on. Receiving IV fluids per orders. Patient has an external catheter on, incontinence care provided. Labs and vitals being monitored.  Problem: Adult Inpatient Plan of Care  Goal: Patient-Specific Goal (Individualized)  Outcome: Ongoing (see interventions/notes)  Flowsheets (Taken 01/13/2023 2300)  Individualized Care Needs: monitor labs and vitals  Anxieties, Fears or Concerns: None voiced at this time  Patient-Specific Goals (Include Timeframe): wants to go home  Plan of Care Reviewed With: patient     Problem: Fall Injury Risk  Goal: Absence of Fall and Fall-Related Injury  Outcome: Ongoing (see interventions/notes)

## 2023-01-14 NOTE — ED Notes (Signed)
[facility]  Peer Recovery Coach Assessment    Initial Evaluation  Referred by:: Nurse  Location of Evaluation: Inpatient/Obs Unit  How many times in the last 12 months have you been to the ED?: 1  Have you ever served or are you currently serving in the Armed Forces?: No             Substance Use History  Patient current substance use status: 1 beer every night with dinner and 2 bourbons that he sips on after 7pm.  This is an everyday occasion.    Prior treatment history?: No    Currently enrolled in substance use program?: No    Within the last 30 days, what substances has the patient used?: Alcohol  Patient's age at first substance use?: 21-25  Drug route of administration: Oral                   Family, Social, Home & Safety History  Marital Status: Widowed    Number of children: 3       Need to improve relationships with family?: No    Social network: Immediate family, Non-substance using peers/friends/other    Current living situation: With family  Any help needed with the following?: None  Contact phone number for the patient: [phone]  Emergency contact name and phone number: Hachey,LEE (Son)  [phone]  Birdie Hopes  [phone]  Daughter    Has the patient had any legal issues within the past 30 days?: None         Employment  Current employment status: Other (Retired)    Diplomatic Services operational officer?: No  Needs assistance with job search?: No    Engagement  Readiness ruler: 1  Summary of assessment priority areas: Substance abuse treatment    Brief Intervention  Discussed plan to reduce/quit substance use?: Yes  Discussed willingness to enter treatment?: No  Indicated patient's stage of change:: 1 - Precontemplation    Patient seen by Peer Recovery Coach and is a candidate for buprenorphine administration in the ED. Patient needs assessment for bup treatment.: No    Plan  Was the patient referred to treatment?: No    Was patient referred to physician for  Buprenorphine Assessment in the ED?: No    Did patient receive Narcan in the ED?: No         Follow-up  Patient admitted for treatment?: No        Need for additional follow-up?: No       Trina Ao, Peer Recovery Coach 01/14/2023 10:29

## 2023-01-14 NOTE — Progress Notes (Signed)
[facility]   Progress Note    Jesse Cooper.  Date of service: 01/14/2023  Date of Admission:  01/13/2023  Hospital Day:  LOS: 1 day       HPI  82 year old white male was brought to the emergency room for evaluation after a fall.  He lives alone  Patient has dementia but currently is alert oriented x3 awake and appropriate however he does not recall falling  His son found him face down in the floor  Last time they saw him was 2 days ago so no one really knows how long he was in the floor  He denies any headache visual problems slurred speech or focal weakness  Denies any chest neck arm back or jaw pain  Denies palpitations or dizziness  There was no incontinence of bowel or bladder control  No reported seizure activity  He is on Eliquis for chronic atrial fibrillation  He denies any complaints of pain related to the fall    Subjective: No acute events overnight, HD stable, AAOx3    Review of Systems:    Review of Systems   Constitutional:  Negative for chills and fever.   HENT:  Negative for ear pain and sore throat.    Eyes:  Negative for pain and visual disturbance.   Respiratory:  Negative for cough and shortness of breath.    Cardiovascular:  Negative for chest pain and palpitations.   Gastrointestinal:  Negative for abdominal pain and vomiting.   Genitourinary:  Negative for dysuria and hematuria.   Musculoskeletal:  Negative for arthralgias and back pain.   Skin:  Negative for color change and rash.   Neurological:  Negative for seizures and syncope.   All other systems reviewed and are negative.      Objective:      Vital Signs:  Vitals:    01/13/23 2215 01/13/23 2300 01/13/23 2331 01/14/23 0843   BP: 135/74   (!) 126/58   Pulse: 80  91 70   Resp:    20   Temp: 37.2 C (99 F)   36.7 C (98 F)   SpO2: 98%   97%   Weight:  74.3 kg (163 lb 14.4 oz)     Height:  1.778 m (5\' 10" )     BMI:  23.57              I/O:  I/O last 24 hours:    Intake/Output Summary (Last 24 hours) at 01/14/2023  1053  Last data filed at 01/14/2023 0947  Gross per 24 hour   Intake 1240 ml   Output --   Net 1240 ml         acetaminophen (TYLENOL) tablet, 650 mg, Oral, Q4H PRN  apixaban (ELIQUIS) tablet, 2.5 mg, Oral, 2x/day  carvedilol (COREG) tablet, 12.5 mg, Oral, 2x/day-Food  ipratropium-albuterol 0.5 mg-3 mg(2.5 mg base)/3 mL Solution for Nebulization, 3 mL, Nebulization, Q4H PRN  NS flush syringe, 3 mL, Intracatheter, Q8HRS  NS flush syringe, 3 mL, Intracatheter, Q1H PRN  NS premix infusion, , Intravenous, Continuous  ondansetron (ZOFRAN) 2 mg/mL injection, 4 mg, Intravenous, Q6H PRN        Physical Exam:    Physical Exam  Vitals and nursing note reviewed.   Constitutional:       General: He is not in acute distress.     Appearance: He is well-developed.   HENT:      Head: Normocephalic and atraumatic.   Eyes:  Conjunctiva/sclera: Conjunctivae normal.   Cardiovascular:      Rate and Rhythm: Normal rate and regular rhythm.      Heart sounds: No murmur heard.  Pulmonary:      Effort: Pulmonary effort is normal. No respiratory distress.      Breath sounds: Normal breath sounds.   Abdominal:      Palpations: Abdomen is soft.      Tenderness: There is no abdominal tenderness.   Musculoskeletal:         General: No swelling.      Cervical back: Neck supple.   Skin:     General: Skin is warm and dry.      Capillary Refill: Capillary refill takes less than 2 seconds.   Neurological:      Mental Status: He is alert.   Psychiatric:         Mood and Affect: Mood normal.         Labs:  Results for orders placed or performed during the hospital encounter of 01/13/23 (from the past 24 hour(s))   CBC/DIFF    Narrative    The following orders were created for panel order CBC/DIFF.  Procedure                               Abnormality         Status                     ---------                               -----------         ------                     CBC WITH UYQI[[id number]]                Abnormal            Final result                  Please view results for these tests on the individual orders.   COMPREHENSIVE METABOLIC PANEL, NON-FASTING   Result Value Ref Range    SODIUM 145 136 - 145 mmol/L    POTASSIUM 3.6 3.5 - 5.1 mmol/L    CHLORIDE 108 (H) 98 - 107 mmol/L    CO2 TOTAL 19 (L) 21 - 31 mmol/L    ANION GAP 18 (H) 4 - 13 mmol/L    BUN 23 7 - 25 mg/dL    CREATININE 3.87 5.64 - 1.30 mg/dL    BUN/CREA RATIO 23 (H) 6 - 22    ESTIMATED GFR 75 >59 mL/min/1.15m^2    ALBUMIN 4.8 3.5 - 5.7 g/dL    CALCIUM 9.6 8.6 - 33.2 mg/dL    GLUCOSE 951 (H) 74 - 109 mg/dL    ALKALINE PHOSPHATASE 67 34 - 104 U/L    ALT (SGPT) 45 7 - 52 U/L    AST (SGOT) 107 (H) 13 - 39 U/L    BILIRUBIN TOTAL 2.4 (H) 0.3 - 1.0 mg/dL    PROTEIN TOTAL 8.0 6.4 - 8.9 g/dL    ALBUMIN/GLOBULIN RATIO 1.5 (H) 0.8 - 1.4    OSMOLALITY, CALCULATED 294 (H) 270 - 290 mOsm/kg    CALCIUM, CORRECTED 9.0 8.9 - 10.8 mg/dL    GLOBULIN 3.2 2.9 -  5.4    Narrative    Estimated Glomerular Filtration Rate (eGFR) is calculated using the CKD-EPI (2021) equation, intended for patients 41 years of age and older. If gender is not documented or "unknown", there will be no eGFR calculation.     CREATINE KINASE (CK), TOTAL, SERUM OR PLASMA   Result Value Ref Range    CREATINE KINASE 3,862 (H) 30 - 223 U/L   C-REACTIVE PROTEIN (CRP)   Result Value Ref Range    C-REACTIVE PROTEIN (CRP) 6.8 (H) 0.1 - 0.5 mg/dL   LACTIC ACID LEVEL W/ REFLEX FOR LEVEL >2.0   Result Value Ref Range    LACTIC ACID 3.7 (H) 0.5 - 2.2 mmol/L   URINALYSIS, MACROSCOPIC AND MICROSCOPIC W/CULTURE REFLEX [PRN ONLY]    Specimen: Urine, Clean Catch    Narrative    The following orders were created for panel order URINALYSIS, MACROSCOPIC AND MICROSCOPIC W/CULTURE REFLEX [PRN ONLY].  Procedure                               Abnormality         Status                     ---------                               -----------         ------                     URINALYSIS, MACROSCOPIC[[id number]]      Abnormal            Final result                URINALYSIS, MICROSCOPIC[[id number]]      Normal              Final result                 Please view results for these tests on the individual orders.   PT/INR   Result Value Ref Range    PROTHROMBIN TIME 13.1 (H) 9.8 - 12.7 seconds    INR 1.12 (H) 0.84 - 1.10    Narrative    INR OF 2.0-3.0  RECOMMENDED FOR: PROPHYLAXIS/TREATMENT OF VENEOUS THROMBOSIS, PULMONARY EMBOLISM, PREVENTION OF SYSTEMIC EMBOLISM FROM ATRIAL FIBRILATION, MYOCARDIAL INFARCTION.    INR OF 2.5-3.5  RECOMMENDED FOR MECHANICAL PROSTHETIC HEART VALVES, RECURRENT SYSTEMIC EMBOLISM, RECURRENT MYOCARDIAL INFARCTION.     PTT (PARTIAL THROMBOPLASTIN TIME)   Result Value Ref Range    APTT 34.0 25.0 - 38.0 seconds   TROPONIN-I   Result Value Ref Range    TROPONIN I 59 (H) <20 ng/L   CBC WITH DIFF   Result Value Ref Range    WBC 8.0 3.6 - 10.2 x10^3/uL    RBC 4.56 4.06 - 5.63 x10^6/uL    HGB 15.4 12.5 - 16.3 g/dL    HCT 29.5 62.1 - 30.8 %    MCV 101.3 (H) 73.0 - 96.2 fL    MCH 33.7 (H) 23.8 - 33.4 pg    MCHC 33.3 32.5 - 36.3 g/dL    RDW 65.7 84.6 - 96.2 %    PLATELETS 106 (L) 140 - 440 x10^3/uL    MPV 8.2 7.4 - 11.4 fL    NEUTROPHIL % 84 (H) 43 - 77 %  LYMPHOCYTE % 12 (L) 16 - 44 %    MONOCYTE % 4 (L) 5 - 13 %    EOSINOPHIL % 0 (L) 1 - 8 %    BASOPHIL % 0 0 - 1 %    NEUTROPHIL # 6.70 1.85 - 7.80 x10^3/uL    LYMPHOCYTE # 0.90 (L) 1.00 - 3.00 x10^3/uL    MONOCYTE # 0.30 0.30 - 1.00 x10^3/uL    EOSINOPHIL # 0.00 0.00 - 0.50 x10^3/uL    BASOPHIL # 0.00 0.00 - 0.10 x10^3/uL   URINALYSIS, MACROSCOPIC   Result Value Ref Range    COLOR Yellow Colorless, Light Yellow, Yellow    APPEARANCE Clear Clear    SPECIFIC GRAVITY >1.030 (H) 1.002 - 1.030    PH 5.0 5.0 - 9.0    LEUKOCYTES Negative Negative, 100  WBCs/uL    NITRITE Negative Negative    PROTEIN 30 (A) Negative, 10 , 20  mg/dL    GLUCOSE 50 (A) Negative, 30  mg/dL    KETONES 10 (A) Negative, Trace mg/dL    BILIRUBIN Negative Negative, 0.5 mg/dL    BLOOD Negative Negative mg/dL    UROBILINOGEN Normal Normal  mg/dL   URINALYSIS, MICROSCOPIC   Result Value Ref Range    RBCS 1 <4 /hpf    WBCS 0 <6 /hpf    SQUAMOUS EPITHELIAL 1 <28 /hpf   COVID-19, FLU A/B, RSV RAPID BY PCR   Result Value Ref Range    SARS-CoV-2 Not Detected Not Detected    INFLUENZA VIRUS TYPE A Not Detected Not Detected    INFLUENZA VIRUS TYPE B Not Detected Not Detected    RESPIRATORY SYNCTIAL VIRUS (RSV) Not Detected Not Detected    Narrative    Results are for the simultaneous qualitative identification of SARS-CoV-2 (formerly 2019-nCoV), Influenza A, Influenza B, and RSV RNA. These etiologic agents are generally detectable in nasopharyngeal and nasal swabs during the ACUTE PHASE of infection. Hence, this test is intended to be performed on respiratory specimens collected from individuals with signs and symptoms of upper respiratory tract infection who meet Centers for Disease Control and Prevention (CDC) clinical and/or epidemiological criteria for Coronavirus Disease 2019 (COVID-19) testing. CDC COVID-19 criteria for testing on human specimens is available at [facility] webpage information for Healthcare Professionals: Coronavirus Disease 2019 (COVID-19) ([employer]).     False-negative results may occur if the virus has genomic mutations, insertions, deletions, or rearrangements or if performed very early in the course of illness. Otherwise, negative results indicate virus specific RNA targets are not detected, however negative results do not preclude SARS-CoV-2 infection/COVID-19, Influenza, or Respiratory syncytial virus infection. Results should not be used as the sole basis for patient management decisions. Negative results must be combined with clinical observations, patient history, and epidemiological information. If upper respiratory tract infection is still suspected based on exposure history together with other clinical findings, re-testing should be considered.    Test methodology:   Cepheid Xpert  Xpress SARS-CoV-2/Flu/RSV Assay real-time polymerase chain reaction (RT-PCR) test on the GeneXpert Dx and Xpert Xpress systems.   EXTRA TUBES    Narrative    The following orders were created for panel order EXTRA TUBES.  Procedure                               Abnormality         Status                     ---------                               -----------         ------  GOLD TOP GURK[[id number]]                                    Final result                 Please view results for these tests on the individual orders.   GOLD TOP TUBE   Result Value Ref Range    RAINBOW/EXTRA TUBE AUTO RESULT Yes    LACTIC ACID - FIRST REFLEX   Result Value Ref Range    LACTIC ACID 1.5 0.5 - 2.2 mmol/L   ARTERIAL BLOOD GAS/LACTATE   Result Value Ref Range    PH (ARTERIAL) 7.38 7.35 - 7.45    PCO2 (ARTERIAL) 29 (L) 35 - 45 mm/Hg    BICARBONATE (ARTERIAL) 19.3 (L) 20.0 - 26.0 mmol/L    MET-HEMOGLOBIN 0.1 <=2.0 %    LACTATE      CARBOXYHEMOGLOBIN 1.6 (H) <=1.5 %    O2CT 19.9 %    %FIO2 (ARTERIAL) 21 %    PO2 (ARTERIAL) 84 80 - 100 mm/Hg    OXYHEMOGLOBIN 95.4 88.0 - 100.0 %    ALLEN TEST passed     DRAW SITE RT RAD     BASE DEFICIT 7.8 (H) 0.0 - 2.0 mmol/L    Narrative    .  Marland Kitchen   ETHANOL, SERUM   Result Value Ref Range    ETHANOL <10 0 mg/dL   TROPONIN-I   Result Value Ref Range    TROPONIN I 67 (H) <20 ng/L   MAGNESIUM   Result Value Ref Range    MAGNESIUM 2.3 1.9 - 2.7 mg/dL   BASIC METABOLIC PANEL, NON-FASTING   Result Value Ref Range    SODIUM 143 136 - 145 mmol/L    POTASSIUM 3.5 3.5 - 5.1 mmol/L    CHLORIDE 110 (H) 98 - 107 mmol/L    CO2 TOTAL 19 (L) 21 - 31 mmol/L    ANION GAP 14 (H) 4 - 13 mmol/L    CALCIUM 8.5 (L) 8.6 - 10.3 mg/dL    GLUCOSE 831 (H) 74 - 109 mg/dL    BUN 25 7 - 25 mg/dL    CREATININE 5.17 6.16 - 1.30 mg/dL    BUN/CREA RATIO 27 (H) 6 - 22    ESTIMATED GFR 84 >59 mL/min/1.49m^2    OSMOLALITY, CALCULATED 290 270 - 290 mOsm/kg    Narrative    Estimated Glomerular Filtration Rate (eGFR) is  calculated using the CKD-EPI (2021) equation, intended for patients 71 years of age and older. If gender is not documented or "unknown", there will be no eGFR calculation.     CBC/DIFF    Narrative    The following orders were created for panel order CBC/DIFF.  Procedure                               Abnormality         Status                     ---------                               -----------         ------  CBC WITH VWUJ[[id number]]                Abnormal            Final result                 Please view results for these tests on the individual orders.   CREATINE KINASE (CK), TOTAL, SERUM OR PLASMA   Result Value Ref Range    CREATINE KINASE 4,899 (H) 30 - 223 U/L   CBC WITH DIFF   Result Value Ref Range    WBC 6.7 3.6 - 10.2 x10^3/uL    RBC 3.96 (L) 4.06 - 5.63 x10^6/uL    HGB 13.6 12.5 - 16.3 g/dL    HCT 95.6 21.3 - 08.6 %    MCV 101.0 (H) 73.0 - 96.2 fL    MCH 34.2 (H) 23.8 - 33.4 pg    MCHC 33.9 32.5 - 36.3 g/dL    RDW 57.8 46.9 - 62.9 %    PLATELETS 98 (L) 140 - 440 x10^3/uL    MPV 8.2 7.4 - 11.4 fL    NEUTROPHIL % 77 43 - 77 %    LYMPHOCYTE % 16 16 - 44 %    MONOCYTE % 7 5 - 13 %    EOSINOPHIL % 0 (L) 1 - 8 %    BASOPHIL % 0 0 - 1 %    NEUTROPHIL # 5.20 1.85 - 7.80 x10^3/uL    LYMPHOCYTE # 1.10 1.00 - 3.00 x10^3/uL    MONOCYTE # 0.50 0.30 - 1.00 x10^3/uL    EOSINOPHIL # 0.00 0.00 - 0.50 x10^3/uL    BASOPHIL # 0.00 0.00 - 0.10 x10^3/uL   SCAN DIFFERENTIAL   Result Value Ref Range    ANISOCYTOSIS 1+ (10-25%)     OVALOCYTE (ELLIPTOCYTE) 1+ (10-25%)     PLATELET MORPHOLOGY COMMENT Decreased             Imaging:    Results for orders placed or performed during the hospital encounter of 01/13/23 (from the past 24 hour(s))   XR LUMBAR SPINE AP AND LAT     Status: None    Narrative    Hashir T Jentz JR.    RADIOLOGIST: Carma Lair    XR LUMBAR SPINE AP AND LAT performed on 01/13/2023 7:37 PM    CLINICAL HISTORY: Fall.  fall, back pain    TECHNIQUE:  2 views of the lumbar  spine.    COMPARISON: None.    FINDINGS:  Vertebral body heights appear maintained. No evidence of fracture.  Moderate multilevel disc space narrowing. Large osteophytes are seen anteriorly throughout the spine. There is facet arthropathy throughout the lumbar spine which is most pronounced from L3 to S1.  There is some dextrocurvature of the lumbar spine. No significant spondylolisthesis.        Impression    ADVANCED DEGENERATIVE CHANGES OF THE LUMBAR SPINE.          Radiologist location ID: [id number]     CT CERVICAL SPINE WO IV CONTRAST     Status: None    Narrative    Holden T Lupi JR.    RADIOLOGIST: Carma Lair    CT CERVICAL SPINE WO IV CONTRAST performed on 01/13/2023 7:47 PM    CLINICAL HISTORY: fall.  unwittnessed fall    TECHNIQUE:  Cervical spine CT without contrast.      COMPARISON: None.    FINDINGS:  Alignment: Normal    Vertebrae: No acute  fracture    Soft Tissues:   No large prevertebral hematoma  5 mm noncalcified nodule in the left lung apex. This can be followed per Fleischner criteria guidelines.      Impression    NO ACUTE CERVICAL FRACTURE.  DEGENERATIVE CHANGES.    SOLID NODULES - SINGLE  LOW RISK  <64mm:  No routine follow-up*  6-21mm:  CT at 6'12 months, then consider CT at 18'24 months  >2mm:  Consider CT at 3 months, PET/CT, or tissue sampling  HIGH RISK  <47mm:  Optional CT at 12 months*  6-59mm:  CT at 6'12 months, then CT at 18'24 months  >15mm:  Consider CT at 3 months, PET/CT, or tissue sampling  * Certain patients at high risk with suspicious nodule morphology, upper lobe location, or both may warrant 45-month follow-up  (Guidelines for Management of Incidental Pulmonary Nodules Detected on CT Images: From the Fleischner Society 2017)          One or more dose reduction techniques were used (e.g., Automated exposure control, adjustment of the mA and/or kV according to patient size, use of iterative reconstruction technique).      Radiologist location ID: [id number]     CT BRAIN  WO IV CONTRAST     Status: None    Narrative    Tayshawn T Frane JR.    RADIOLOGIST: Carma Lair    CT BRAIN WO IV CONTRAST performed on 01/13/2023 7:50 PM    CLINICAL HISTORY: Fall eliquis.  fall, on eliquis, brusing to right cheek, increased confusion, hx. dementia    TECHNIQUE:  Head CT without intravenous contrast.    COMPARISON: None.    FINDINGS:  There is no acute intracranial hemorrhage, mass effect, or evidence of large acute infarct.    Brain: Low density in the periventricular white matter suggests mild chronic small vessel ischemic changes.    CSF Spaces: Moderate generalized cerebral atrophy     Sinuses/Mastoids:  Mild mucosal thickening at the paranasal sinuses     Bones: Unremarkable        Impression    CHRONIC CHANGES.  NO ACUTE FINDINGS.       One or more dose reduction techniques were used (e.g., Automated exposure control, adjustment of the mA and/or kV according to patient size, use of iterative reconstruction technique).      Radiologist location ID: [id number]           Microbiology:  No results found for any visits on 01/13/23 (from the past 96 hour(s)).        Assessment/ Plan:   Active Hospital Problems    Diagnosis    Primary Problem: Rhabdomyolysis    ** FALL  ** FACIAL CONTUSIONS  ** RHABDOMYOLYSIS  ** GENERALIZED WEAKNESS  -dementia  -chronic persistent atrial fibrillation (on Eliquis)  C/w IV fluids  Creatine kinase trending down  Follow up renal function  Coreg   Eliquis  Memantine  donepezil    PT eval        Disposition Planning:   PT eval    Genella Mech, MD    This note was partially generated using MModal Fluency Direct system, and there may be some incorrect words, spellings, and punctuation that were not noted in checking the note before saving.

## 2023-01-15 DIAGNOSIS — I482 Chronic atrial fibrillation, unspecified: Secondary | ICD-10-CM

## 2023-01-15 DIAGNOSIS — W19XXXA Unspecified fall, initial encounter: Secondary | ICD-10-CM

## 2023-01-15 DIAGNOSIS — F0390 Unspecified dementia without behavioral disturbance: Secondary | ICD-10-CM

## 2023-01-15 DIAGNOSIS — M6282 Rhabdomyolysis: Principal | ICD-10-CM

## 2023-01-15 DIAGNOSIS — R339 Retention of urine, unspecified: Secondary | ICD-10-CM

## 2023-01-15 DIAGNOSIS — Z978 Presence of other specified devices: Secondary | ICD-10-CM

## 2023-01-15 LAB — CBC WITH DIFF
BASOPHIL #: 0 10*3/uL (ref 0.00–0.10)
BASOPHIL %: 1 % (ref 0–1)
EOSINOPHIL #: 0 10*3/uL (ref 0.00–0.50)
EOSINOPHIL %: 1 % (ref 1–8)
HCT: 37.6 % (ref 36.7–47.1)
HGB: 12.8 g/dL (ref 12.5–16.3)
LYMPHOCYTE #: 1 10*3/uL (ref 1.00–3.00)
LYMPHOCYTE %: 24 % (ref 16–44)
MCH: 34.2 pg — ABNORMAL HIGH (ref 23.8–33.4)
MCHC: 34 g/dL (ref 32.5–36.3)
MCV: 100.5 fL — ABNORMAL HIGH (ref 73.0–96.2)
MONOCYTE #: 0.3 10*3/uL (ref 0.30–1.00)
MONOCYTE %: 8 % (ref 5–13)
MPV: 8.1 fL (ref 7.4–11.4)
NEUTROPHIL #: 2.9 10*3/uL (ref 1.85–7.80)
NEUTROPHIL %: 67 % (ref 43–77)
PLATELETS: 84 10*3/uL — ABNORMAL LOW (ref 140–440)
RBC: 3.74 10*6/uL — ABNORMAL LOW (ref 4.06–5.63)
RDW: 13.7 % (ref 12.1–16.2)
WBC: 4.4 10*3/uL (ref 3.6–10.2)

## 2023-01-15 LAB — URINALYSIS, MACROSCOPIC
BILIRUBIN: NEGATIVE mg/dL
BLOOD: 1 mg/dL — AB
GLUCOSE: 500 mg/dL — AB
KETONES: NEGATIVE mg/dL
LEUKOCYTES: 75 WBCs/uL — AB
NITRITE: NEGATIVE
PH: 5.5 (ref 5.0–9.0)
PROTEIN: 70 mg/dL — AB
SPECIFIC GRAVITY: 1.021 (ref 1.002–1.030)
UROBILINOGEN: NORMAL mg/dL

## 2023-01-15 LAB — PHOSPHORUS: PHOSPHORUS: 1.8 mg/dL — ABNORMAL LOW (ref 3.7–7.2)

## 2023-01-15 LAB — BASIC METABOLIC PANEL
ANION GAP: 5 mmol/L (ref 4–13)
BUN/CREA RATIO: 28 — ABNORMAL HIGH (ref 6–22)
BUN: 21 mg/dL (ref 7–25)
CALCIUM: 8.1 mg/dL — ABNORMAL LOW (ref 8.6–10.3)
CHLORIDE: 115 mmol/L — ABNORMAL HIGH (ref 98–107)
CO2 TOTAL: 24 mmol/L (ref 21–31)
CREATININE: 0.74 mg/dL (ref 0.60–1.30)
ESTIMATED GFR: 91 mL/min/{1.73_m2} (ref >59)
GLUCOSE: 114 mg/dL — ABNORMAL HIGH (ref 74–109)
OSMOLALITY, CALCULATED: 291 mOsm/kg — ABNORMAL HIGH (ref 270–290)
POTASSIUM: 3.3 mmol/L — ABNORMAL LOW (ref 3.5–5.1)
SODIUM: 144 mmol/L (ref 136–145)

## 2023-01-15 LAB — URINALYSIS, MICROSCOPIC
RBCS: 29792 /hpf — ABNORMAL HIGH (ref <4)
WBCS: 87 /hpf — ABNORMAL HIGH (ref <6)

## 2023-01-15 LAB — CREATINE KINASE (CK), TOTAL, SERUM OR PLASMA: CREATINE KINASE: 2657 U/L — ABNORMAL HIGH (ref 30–223)

## 2023-01-15 LAB — SCAN DIFFERENTIAL
PLATELET MORPHOLOGY COMMENT: DECREASED
RBC MORPHOLOGY COMMENT: NORMAL

## 2023-01-15 LAB — MAGNESIUM: MAGNESIUM: 2.2 mg/dL (ref 1.9–2.7)

## 2023-01-15 MED ORDER — SODIUM DI- AND MONOPHOSPHATE-POTASSIUM PHOS MONOBASIC 250 MG TABLET
500.0000 mg | ORAL_TABLET | Freq: Three times a day (TID) | ORAL | Status: DC
Start: 2023-01-15 — End: 2023-01-16
  Administered 2023-01-15 – 2023-01-16 (×4): 500 mg via ORAL
  Filled 2023-01-15 (×4): qty 2

## 2023-01-15 MED ORDER — LORAZEPAM 2 MG/ML INJECTION WRAPPER
1.0000 mg | INTRAMUSCULAR | Status: DC | PRN
Start: 2023-01-15 — End: 2023-01-15

## 2023-01-15 MED ORDER — THIAMINE MONONITRATE (VITAMIN B1) 100 MG TABLET
100.0000 mg | ORAL_TABLET | Freq: Every day | ORAL | Status: DC
Start: 2023-01-15 — End: 2023-01-16
  Administered 2023-01-15 – 2023-01-16 (×2): 100 mg via ORAL
  Filled 2023-01-15 (×2): qty 1

## 2023-01-15 MED ORDER — PYRIDOXINE (VITAMIN B6) 50 MG TABLET
100.0000 mg | ORAL_TABLET | Freq: Every day | ORAL | Status: DC
Start: 2023-01-15 — End: 2023-01-16
  Administered 2023-01-15 – 2023-01-16 (×2): 100 mg via ORAL
  Filled 2023-01-15 (×2): qty 2

## 2023-01-15 MED ORDER — FOLIC ACID 1 MG TABLET
1.0000 mg | ORAL_TABLET | Freq: Every day | ORAL | Status: DC
Start: 2023-01-15 — End: 2023-01-16
  Administered 2023-01-15 – 2023-01-16 (×2): 1 mg via ORAL
  Filled 2023-01-15 (×2): qty 1

## 2023-01-15 MED ORDER — TAMSULOSIN 0.4 MG CAPSULE
0.4000 mg | ORAL_CAPSULE | Freq: Every day | ORAL | Status: DC
Start: 2023-01-15 — End: 2023-01-16
  Administered 2023-01-15 – 2023-01-16 (×2): 0.4 mg via ORAL
  Filled 2023-01-15 (×2): qty 1

## 2023-01-15 MED ORDER — ETHYL ALCOHOL 62 % (NOZIN NASAL SANITIZER) NASAL SOLUTION - BULK BOTTLE
1.0000 | Freq: Two times a day (BID) | NASAL | Status: DC
Start: 2023-01-15 — End: 2023-01-16
  Administered 2023-01-15 – 2023-01-16 (×2): 1 via NASAL

## 2023-01-15 MED ORDER — POTASSIUM CHLORIDE ER 20 MEQ TABLET,EXTENDED RELEASE(PART/CRYST)
40.0000 meq | ORAL_TABLET | ORAL | Status: AC
Start: 2023-01-15 — End: 2023-01-15
  Administered 2023-01-15: 40 meq via ORAL
  Filled 2023-01-15: qty 2

## 2023-01-15 MED ORDER — MAGNESIUM HYDROXIDE 400 MG/5 ML ORAL SUSPENSION
30.0000 mL | Freq: Every evening | ORAL | Status: DC | PRN
Start: 2023-01-15 — End: 2023-01-16

## 2023-01-15 MED ORDER — ALUMINUM-MAG HYDROXIDE-SIMETHICONE 200 MG-200 MG-20 MG/5 ML ORAL SUSP
30.0000 mL | ORAL | Status: DC | PRN
Start: 2023-01-15 — End: 2023-01-15

## 2023-01-15 NOTE — Discharge Summary (Addendum)
[facility]  DISCHARGE SUMMARY    PATIENT NAME:  Jesse Cooper, Jesse Cooper.  MRN:  [id number]  DOB:  [birth year]    ENCOUNTER DATE:  01/13/2023  INPATIENT ADMISSION DATE: 01/13/2023  DISCHARGE DATE:  01/15/2023    ATTENDING PHYSICIAN: Genella Mech, MD  SERVICE: PRN HOSPITALIST 4  PRIMARY CARE PHYSICIAN: Stormy Fabian, NP-C       No lay caregiver identified.    PRIMARY DISCHARGE DIAGNOSIS: Rhabdomyolysis  Active Hospital Problems    Diagnosis Date Noted    Principal Problem: Rhabdomyolysis [M62.82] 01/13/2023      Resolved Hospital Problems   No resolved problems to display.     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    COPD (chronic obstructive pulmonary disease) (CMS HCC) 08/19/2021    Hypomagnesemia 08/19/2021    IFG (impaired fasting glucose) 08/19/2021    Impaired functional mobility, balance, gait, and endurance 08/19/2021    Vitamin D deficiency, unspecified 08/19/2021    Sleep apnea 08/04/2021    Atrial fibrillation (CMS HCC) 08/04/2021    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        Details   albuterol sulfate 90 mcg/actuation oral inhaler  Commonly known as: PROVENTIL or VENTOLIN or PROAIR   2 Puffs, Inhalation, EVERY 6 HOURS PRN  Qty: 6.7 Each  Refills: 1     ampicillin 500 mg Capsule  Commonly known as:  PRINCIPEN   TAKE ONE CAPSULE BY MOUTH ONCE EVERY DAY  Qty: 90 Capsule  Refills: 1     * Breo Ellipta 100-25 mcg/dose Disk with Device  Generic drug: fluticasone furoate-vilanteroL   1 Inhalation, Inhalation, DAILY  Refills: 0     carvediloL 25 mg Tablet  Commonly known as: COREG   25 mg, Oral, 2 TIMES DAILY  Refills: 0     cholecalciferol (vitamin D3) 1,250 mcg (50,000 unit) Capsule   TAKE ONE CAPSULE BY MOUTH ONCE EVERY SEVEN DAYS  Qty: 13 Capsule  Refills: 1     Eliquis 5 mg Tablet  Generic drug: apixaban   5 mg, Oral, 2 TIMES DAILY  Refills: 0     Jardiance 10 mg Tablet  Generic drug: empagliflozin   10 mg, Oral, DAILY  Refills: 0     losartan 100 mg Tablet  Commonly known as: COZAAR   100 mg, Oral, DAILY  Qty: 90 Tablet  Refills: 1     Magnesium 250 mg Tablet   250 mg, Oral, DAILY  Refills: 0     memantine 10 mg Tablet  Commonly known as: NAMENDA   TAKE 1/2 TABLET BY MOUTH ONCE EVERY DAY at bedtime FOR 7 DAYS THEN TAKE 1/2 TABLET TWICE DAILY FOR 7 DAYS THEN TAKE 1/2  TABLET EVERY MORNING AND TAKE ONE TABLET AT BEDTIME FOR 7 DAYS THEN TAKE ONE TABLET TWICE DAILY THEREAFTER  Refills: 0     multivitamin Tablet  Commonly known as: SUPER THERA VITE M   1 Tablet, Oral  Refills: 0     rivastigmine 1.5 mg Capsule  Commonly known as: EXELON   1.5 mg, Oral, 2 TIMES DAILY WITH FOOD  Refills: 0     rosuvastatin 20 mg Tablet  Commonly known as: CRESTOR   20 mg, Oral, DAILY  Qty: 90 Tablet  Refills: 1           * This list has 1 medication(s) that are the same as other medications prescribed for you. Read the directions carefully, and ask your doctor or other care provider to review them with you.                ASK your doctor about these medications.        Details   * fluticasone furoate-vilanteroL 100-25 mcg/dose Disk with Device  Commonly known as: Breo Ellipta  Ask about: Should I take this medication?   1 Inhalation, Inhalation, DAILY  Qty: 3 Each  Refills: 1           * This list has 1 medication(s) that are the same  as other medications prescribed for you. Read the directions carefully, and ask your doctor or other care provider to review them with you.                Discharge med list refreshed?  YES     Allergies   Allergen Reactions    Hydrocodone-Homatropine Itching     HOSPITAL PROCEDURE(S):   No orders of the defined types were placed in this encounter.      REASON FOR HOSPITALIZATION AND HOSPITAL COURSE   BRIEF HPI:  This is a 82 y.o., male admitted for rhabdomylosis  BRIEF HOSPITAL NARRATIVE:     Patient is a 82 year old male with a past medical history significant for dementia and chronic AFib who had a fall at home, was brought to emergency room by his son.  Patient was diagnosed to have rhabdomyolysis with creatinine kinase more than 5000, he was started on IV fluids.  Patient did well over hospital course, creatinine kinase coming down, renal function stable.  Patient is alert and oriented x3 and wants to go home.  Pending physical therapy eval but patient denies to go to short-term rehab.  He reports that he has walker at home.  Patient stable to be discharged to home.    Also patient had concerns regarding urinary retention, we will start the patient on Flomax..  Urology saw the patient, recommended to discharge patient with Foley.  Patient will follow up with urologist outpatient    TRANSITION/POST DISCHARGE CARE/PENDING TESTS/REFERRALS:     CONDITION ON DISCHARGE:  A. Ambulation: Ambulation with assistive device  B. Self-care Ability: Complete  C. Cognitive Status Alert  D. Code status at discharge:       LINES/DRAINS/WOUNDS AT DISCHARGE:   Patient Lines/Drains/Airways Status       Active Line / Dialysis Catheter / Dialysis Graft / Drain / Airway / Wound       Name Placement date Placement time Site Days    Peripheral IV Right Cephalic  (lateral side of arm) 01/14/23  2340  -- less than 1    Peripheral IV Left Cephalic  (lateral side of arm) 01/14/23  2350  -- less than  1                    DISCHARGE  DISPOSITION:  Home discharge  DISCHARGE INSTRUCTIONS:  Post-Discharge Follow Up Appointments       Follow up with Stormy Fabian, NP-C    Phone: [phone]    Where: [facility]      Tuesday Apr 11, 2023    Return Patient Visit with Stormy Fabian, NP-C at  9:00 AM      Family Medicine, [facility] Medicine [facility]  [facility] Medicine [facility] [place]   [address]  [place] [address]  [phone]          No discharge procedures on file.       Genella Mech, MD    Copies sent to Care Team         Relationship Specialty Notifications Start End    Stormy Fabian, NP-C PCP - General NURSE PRACTITIONER All results, Admissions 08/18/21     Phone: [phone] Fax: [phone]         [facility] FAMILY MEDICINE [address] [place] [address]    Dr. Andria Meuse Dentist   11/08/12     [facility]    Dr. Tessie Eke Physician   10/12/21     [facility]  CARDIOVASCULAR DISEASE  10/06/22     Phone: [phone] Fax: [phone]         [address] [place] [address]            Referring providers can utilize [email or web address] to access their referred [facility] patient's information.

## 2023-01-15 NOTE — Nurses Notes (Signed)
#  16 foley catheter inserted without difficulty. Obtained 750 ml of grape colored urine. Dr. Eben Burow notified.

## 2023-01-15 NOTE — Nurses Notes (Signed)
Pt unable to void. Bladder scanned x3 with results greater than 400 ml each time. Dr. Eben Burow notified . Order given for foley catheter and consult to Dr. Fredonia Highland urology to see for urinary retention.

## 2023-01-15 NOTE — Care Plan (Signed)
Pt admitted with rhabdomyolysis. NS infusing. Bladder scanned twice so far this shift for 488 ml and 660 ml. Straight cathed once for 750 ml dark brown urine after multiple attempts by pt to void on his own. On room air. Fall precautions in use. Up with one to two assist, turned Q2H and PRN. VS, labs, output monitored. Education and discharge planning ongoing.     Problem: Adult Inpatient Plan of Care  Goal: Patient-Specific Goal (Individualized)  Outcome: Ongoing (see interventions/notes)  Flowsheets  Taken 01/15/2023 0205  Plan of Care Reviewed With: patient  Taken 01/14/2023 2000  Individualized Care Needs: IVF, bladder scan, monitor output, VS and labs, PT, turn

## 2023-01-15 NOTE — Nurses Notes (Signed)
Patient bladder scanned and straight cath per protocol. Jesse Cooper informed of volume and brownish red color. Will monitor for ability of voiding through the night.

## 2023-01-15 NOTE — Nurses Notes (Signed)
Urine color is red at this time. Dr. Eben Burow notified and will hold Eliquis.

## 2023-01-16 DIAGNOSIS — R310 Gross hematuria: Secondary | ICD-10-CM

## 2023-01-16 DIAGNOSIS — I4891 Unspecified atrial fibrillation: Secondary | ICD-10-CM

## 2023-01-16 DIAGNOSIS — I252 Old myocardial infarction: Secondary | ICD-10-CM

## 2023-01-16 DIAGNOSIS — N401 Enlarged prostate with lower urinary tract symptoms: Secondary | ICD-10-CM

## 2023-01-16 DIAGNOSIS — Z950 Presence of cardiac pacemaker: Secondary | ICD-10-CM

## 2023-01-16 DIAGNOSIS — R9431 Abnormal electrocardiogram [ECG] [EKG]: Secondary | ICD-10-CM

## 2023-01-16 DIAGNOSIS — N400 Enlarged prostate without lower urinary tract symptoms: Secondary | ICD-10-CM

## 2023-01-16 DIAGNOSIS — R339 Retention of urine, unspecified: Secondary | ICD-10-CM

## 2023-01-16 DIAGNOSIS — R338 Other retention of urine: Secondary | ICD-10-CM

## 2023-01-16 HISTORY — DX: Retention of urine, unspecified: R33.9

## 2023-01-16 HISTORY — DX: Gross hematuria: R31.0

## 2023-01-16 LAB — CBC WITH DIFF
BASOPHIL #: 0 10*3/uL (ref 0.00–0.10)
BASOPHIL %: 1 % (ref 0–1)
EOSINOPHIL #: 0.1 10*3/uL (ref 0.00–0.50)
EOSINOPHIL %: 2 % (ref 1–8)
HCT: 39.5 % (ref 36.7–47.1)
HGB: 13.4 g/dL (ref 12.5–16.3)
LYMPHOCYTE #: 1.2 10*3/uL (ref 1.00–3.00)
LYMPHOCYTE %: 30 % (ref 16–44)
MCH: 33.7 pg — ABNORMAL HIGH (ref 23.8–33.4)
MCHC: 33.8 g/dL (ref 32.5–36.3)
MCV: 99.8 fL — ABNORMAL HIGH (ref 73.0–96.2)
MONOCYTE #: 0.3 10*3/uL (ref 0.30–1.00)
MONOCYTE %: 7 % (ref 5–13)
MPV: 8.3 fL (ref 7.4–11.4)
NEUTROPHIL #: 2.3 10*3/uL (ref 1.85–7.80)
NEUTROPHIL %: 60 % (ref 43–77)
PLATELETS: 77 10*3/uL — ABNORMAL LOW (ref 140–440)
RBC: 3.96 10*6/uL — ABNORMAL LOW (ref 4.06–5.63)
RDW: 13.7 % (ref 12.1–16.2)
WBC: 3.8 10*3/uL (ref 3.6–10.2)

## 2023-01-16 LAB — ECG 12 LEAD
Calculated R Axis: -83 degrees
Calculated T Axis: 91 degrees
QRS Duration: 118 ms
QT Interval: 400 ms
QTC Calculation: 478 ms
Ventricular rate: 86 {beats}/min

## 2023-01-16 LAB — MAGNESIUM: MAGNESIUM: 2 mg/dL (ref 1.9–2.7)

## 2023-01-16 LAB — BASIC METABOLIC PANEL
ANION GAP: 3 mmol/L — ABNORMAL LOW (ref 4–13)
BUN/CREA RATIO: 23 — ABNORMAL HIGH (ref 6–22)
BUN: 14 mg/dL (ref 7–25)
CALCIUM: 8.1 mg/dL — ABNORMAL LOW (ref 8.6–10.3)
CHLORIDE: 118 mmol/L — ABNORMAL HIGH (ref 98–107)
CO2 TOTAL: 22 mmol/L (ref 21–31)
CREATININE: 0.61 mg/dL (ref 0.60–1.30)
ESTIMATED GFR: 96 mL/min/{1.73_m2} (ref >59)
GLUCOSE: 119 mg/dL — ABNORMAL HIGH (ref 74–109)
OSMOLALITY, CALCULATED: 287 mOsm/kg (ref 270–290)
POTASSIUM: 3.6 mmol/L (ref 3.5–5.1)
SODIUM: 143 mmol/L (ref 136–145)

## 2023-01-16 LAB — SCAN DIFFERENTIAL
PLATELET MORPHOLOGY COMMENT: DECREASED
RBC MORPHOLOGY COMMENT: NORMAL

## 2023-01-16 LAB — PHOSPHORUS: PHOSPHORUS: 2.3 mg/dL — ABNORMAL LOW (ref 3.7–7.2)

## 2023-01-16 MED ORDER — FLUTICASONE FUROATE 100 MCG-VILANTEROL 25 MCG/DOSE INHALATION POWDER
1.0000 | DISK | Freq: Every day | RESPIRATORY_TRACT | 1 refills | Status: DC
Start: 2023-01-16 — End: 2023-05-04

## 2023-01-16 MED ORDER — FINASTERIDE 5 MG TABLET
5.0000 mg | ORAL_TABLET | Freq: Every morning | ORAL | Status: DC
Start: 2023-01-16 — End: 2023-01-16
  Administered 2023-01-16: 5 mg via ORAL
  Filled 2023-01-16: qty 1

## 2023-01-16 MED ORDER — THIAMINE MONONITRATE (VITAMIN B1) 100 MG TABLET
100.0000 mg | ORAL_TABLET | Freq: Every day | ORAL | 0 refills | Status: AC
Start: 2023-01-17 — End: 2023-02-16

## 2023-01-16 MED ORDER — FINASTERIDE 5 MG TABLET
5.0000 mg | ORAL_TABLET | Freq: Every day | ORAL | 3 refills | Status: DC
Start: 2023-01-16 — End: 2023-12-20

## 2023-01-16 MED ORDER — FOLIC ACID 1 MG TABLET
1.0000 mg | ORAL_TABLET | Freq: Every day | ORAL | 0 refills | Status: AC
Start: 2023-01-17 — End: 2023-04-17

## 2023-01-16 MED ORDER — PYRIDOXINE (VITAMIN B6) 100 MG TABLET
100.0000 mg | ORAL_TABLET | Freq: Every day | ORAL | 0 refills | Status: AC
Start: 2023-01-17 — End: 2023-02-16

## 2023-01-16 MED ORDER — TAMSULOSIN 0.4 MG CAPSULE
0.4000 mg | ORAL_CAPSULE | Freq: Every evening | ORAL | 3 refills | Status: DC
Start: 2023-01-16 — End: 2023-12-20

## 2023-01-16 MED ORDER — SODIUM DI- AND MONOPHOSPHATE-POTASSIUM PHOS MONOBASIC 250 MG TABLET
1.0000 | ORAL_TABLET | Freq: Two times a day (BID) | ORAL | 0 refills | Status: AC
Start: 2023-01-16 — End: 2023-01-26

## 2023-01-16 NOTE — Consults (Signed)
[facility]                                                                   Urology Consultation Initial       Jesse Cooper, [name], 82 y.o. male  Encounter Start Date:  01/13/2023  Inpatient Admission Date:  01/13/2023  Date of Service:  01/16/2023  Date of Birth:  10/15/1940    PCP: Jesse Fabian, NP-C.    Information Obtained from: patient  Chief Complaint: urinary retention       HPI:    Jesse Cooper. is a 82 y.o., White male who presents with a fall.  He was then found to have experienced urinary retention and a Foley catheter was placed.  He then failed a voiding trial thereafter and a catheter was replaced.  He was not on any home BPH medications.  He denies a history of urologic surgery.  He denies a history of urinary tract infections.  He denies a personal or family history of urologic malignancy.       ROS:     ROS Other than ROS in the HPI, all other systems were negative.      PAST MEDICAL/ FAMILY/ SOCIAL HISTORY:    Reviewed/ Summarized records and/or obtained History from patient  Past Medical History:   Diagnosis Date    Pleural effusion associated with hepatic disorder     Dr. Felton Cooper, Pulmonologist.    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    US THORACENTESIS - Approximately 1300 mL of strawcolored fluid was aspirated (Imaging)    Prostate cancer screening          Allergies   Allergen Reactions    Hydrocodone-Homatropine Itching     Medications Prior to Admission       Prescriptions    albuterol sulfate (PROVENTIL OR VENTOLIN OR PROAIR) 90 mcg/actuation Inhalation oral inhaler    Take 2 Puffs by inhalation Every 6 hours as needed    ampicillin (PRINCIPEN) 500 mg Oral Capsule    TAKE ONE CAPSULE BY MOUTH ONCE EVERY DAY    carvediloL (COREG) 25 mg Oral Tablet    Take 1 Tablet (25 mg total) by mouth Twice daily    cholecalciferol, vitamin D3, 1,250 mcg (50,000 unit) Oral Capsule    TAKE ONE CAPSULE BY  MOUTH ONCE EVERY SEVEN DAYS    ELIQUIS 5 mg Oral Tablet    Take 1 Tablet (5 mg total) by mouth Twice daily    fluticasone furoate-vilanteroL (BREO ELLIPTA) 100-25 mcg/dose Inhalation Disk with Device    Take 1 Inhalation by inhalation Once a day for 90 days    fluticasone furoate-vilanteroL (BREO ELLIPTA) 100-25 mcg/dose Inhalation Disk with Device    Take 1 Inhalation by inhalation Once a day    JARDIANCE 10 mg Oral Tablet    Take 1 Tablet (10 mg total) by mouth Once a day    losartan (COZAAR) 100 mg Oral Tablet    Take 1 Tablet (100 mg total) by mouth Once a day    Magnesium 250 mg Oral Tablet    Take 1 Tablet (250 mg total) by mouth Once a day    memantine (NAMENDA) 10 mg Oral Tablet  TAKE 1/2 TABLET BY MOUTH ONCE EVERY DAY at bedtime FOR 7 DAYS THEN TAKE 1/2 TABLET TWICE DAILY FOR 7 DAYS THEN TAKE 1/2 TABLET EVERY MORNING AND TAKE ONE TABLET AT BEDTIME FOR 7 DAYS THEN TAKE ONE TABLET TWICE DAILY THEREAFTER    multivitamin (SUPER THERA VITE M) Oral Tablet    Take 1 Tablet by mouth    rivastigmine (EXELON) 1.5 mg Oral Capsule    Take 1 Capsule (1.5 mg total) by mouth Twice daily with food    rosuvastatin (CRESTOR) 20 mg Oral Tablet    Take 1 Tablet (20 mg total) by mouth Once a day           acetaminophen (TYLENOL) tablet, 650 mg, Oral, Q4H PRN  alcohol 62 % (NOZIN NASAL SANITIZER) nasal solution, 1 Each, Each Nostril, 2x/day  [Held by provider] apixaban (ELIQUIS) tablet, 2.5 mg, Oral, 2x/day  carvedilol (COREG) tablet, 12.5 mg, Oral, 2x/day-Food  donepezil (ARICEPT) tablet, 5 mg, Oral, NIGHTLY  folic acid (FOLVITE) tablet, 1 mg, Oral, Daily  ipratropium-albuterol 0.5 mg-3 mg(2.5 mg base)/3 mL Solution for Nebulization, 3 mL, Nebulization, Q4H PRN  magnesium hydroxide (MILK OF MAGNESIA) 400mg  per 5mL oral liquid, 30 mL, Oral, HS PRN  memantine (NAMENDA) tablet, 10 mg, Oral, Daily  NS flush syringe, 3 mL, Intracatheter, Q8HRS  NS flush syringe, 3 mL, Intracatheter, Q1H PRN  NS premix infusion, , Intravenous,  Continuous  ondansetron (ZOFRAN) 2 mg/mL injection, 4 mg, Intravenous, Q6H PRN  potassium & sodium phosphate (K PHOS NEUTRAL) tablet, 500 mg, Oral, 3x/day-Meals  pyridOXINE Vitamin B6 tablet, 100 mg, Oral, Daily  tamsulosin (FLOMAX) capsule, 0.4 mg, Oral, Daily  thiamine-vitamin B1 tablet, 100 mg, Oral, Daily      Past Surgical History:   Procedure Laterality Date    COLONSCOPY BEDSIDE N/A 2012    [place] - no polyps    HAND SURGERY Left     Jesse Cooper    HX BACK SURGERY N/A 2011    2013 [place]         Social History     Tobacco Use    Smoking status: Former     Current packs/day: 0.00     Average packs/day: 1 pack/day for 13.0 years (13.0 ttl pk-yrs)     Types: Cigarettes     Start date: 6     Quit date: 71     Years since quitting: 49.6    Smokeless tobacco: Never    Tobacco comments:     Quit chewing tobacco in 1988   Substance Use Topics    Alcohol use: Not Currently     Alcohol/week: 8.0 standard drinks of alcohol     Types: 1 Cans of beer, 7 Standard drinks or equivalent per week     Comment: 1 beer with dinner    Drug use: Never       PHYSICAL EXAMINATION:      Temperature: 36.9 C (98.5 F)  Heart Rate: 62  BP (Non-Invasive): (!) 148/83  Respiratory Rate: 18  SpO2: 98 %  Gen: NAD, alert  Pulm: unlabored at rest  CV: palpable pulses  Abd: soft, Nt/ND  GU: no suprapubic tenderness, no CVAT         Impression:   Urinary retention, 1.2L    BPH/LUTS    Gross hematuria on eliquis    Recommendations:    Discharge home with Foley catheter.  Office will plan for voiding trial in 1 week.    Flomax and finasteride  were sent to the patient's pharmacy    We will repeat urinalysis after the patient was outside of this episode of retention.  If hematuria is present at that time then we will proceed with hematuria workup at at this time his hematuria is expected given his anticoagulation use, his enlarged prostate, and an indwelling Foley catheter.    No acute urological surgery indications.  Please page with  questions or concerns    Jesse Hove, DO    A combined total of 45 minutes were spent preparing to see the patient, reviewing previous records, ordering tests/medications/procedures, documenting the clinical encounter as well as performing a medically appropriate evaluation and independently interpreting results and communicating them to the patient/family/caregiver as specifically outlined above in the impression and plan.    This note may have been fully or partially generated using MModal Fluency Direct system, and there may be some incorrect words, spellings, and punctuation that were not identified in checking the note before saving.

## 2023-01-16 NOTE — PT Evaluation (Signed)
[facility]  [address]  [place], [address]  [phone]  (Fax) [phone]  Rehabilitation Services  Physical Therapy Inpatient Initial Evaluation    Patient Name: Jesse Cooper.  Date of Birth: 1940/08/13  Height: Height: 177.8 cm (5\' 10" )  Weight: Weight: 74.3 kg (163 lb 14.4 oz)  Room/Bed: 317/A  Payor: HU[employer] MEDICARE / Plan: HU[employer]NA MEDICARE ADV [place] / Product Type: PPO /       PMH:  Past Medical History:   Diagnosis Date    Pleural effusion associated with hepatic disorder     Dr. Felton Clinton, Pulmonologist.    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 Patient was agreeable to PT evaluation upon arrival this date. Patient completed supine > sitting EOB requiring minimal assistance and STS requiring CGA using FWW. Patient ambulated 270' using FWW requiring CGA. Patient returned to bed following ambulation attempt from sitting EOB > supine requiring SBA. Patient is a good candidate for skilled PT intervention during hospital admisison to improve strength, balance, endurance, and independence with functional mobility.    Discharge Needs:    Equipment Recommendation: (P) front wheeled walker      The patient presents with mobility limitations due to impaired balance, impaired strength, and impaired functional activity tolerance that significantly impair/prevent patient's ability to participate in mobility-related activities of daily living (MRADLs) including  ambulation and transfers in order to safely complete, toileting, bathing, food preparation, laundering/household tasks, safely entering/exiting the home, in reasonable time. This functional mobility deficit can be sufficiently resolved with the use of a (P) front  wheeled walker  in order to decrease the risk of falls, morbidity, and mortality in performance of these MRADLs.  Patient is able to safely use this assistive device.    Discharge Disposition: (P) home with assist, home with home health (Must have assist initially upon d/c to go home at this time.)    JUSTIFICATION OF DISCHARGE RECOMMENDATION   Based on current diagnosis, functional performance prior to admission, and current functional performance, this patient requires continued PT services in (P) home with assist, home with home health (Must have assist initially upon d/c to go home at this time.) in order to achieve significant functional improvements in these deficit areas: (P) aerobic capacity/endurance, gait, locomotion, and balance, posture, muscle performance.        Plan:   Current Intervention: (P) balance training, bed mobility training, home exercise program, gait training, patient/family education, postural re-education, stair training, ROM (range of motion), transfer training, stretching, strengthening  To provide physical therapy services (P) other (see comments) (1-2x/day at least one day weekly.)  for duration of (P) until discharge.    The risks/benefits of therapy have been discussed with the patient/caregiver and he/she is in agreement with the established plan of care.       Subjective & Objective     Past Medical History:   Diagnosis Date    Pleural effusion associated with hepatic disorder     Dr. Felton Clinton, Pulmonologist.    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    US THORACENTESIS - Approximately 1300 mL of strawcolored fluid was  aspirated (Imaging)    Prostate cancer screening             Past Surgical History:   Procedure Laterality Date    COLONSCOPY BEDSIDE N/A 2012    [place] - no polyps    HAND SURGERY Left     Charise Killian    [facility] SURGERY N/A 2011    2013 [place]                 01/16/23 1025   Rehab Session   Document Type evaluation   PT Visit  Date 01/16/23   General Information   Patient Profile Reviewed yes   Pertinent History of Current Functional Problem Patient is an 82 year old male admitted 8/16 after a fall. Patient has a PMH of pleural effusion associated hepatic disorder and dementia. PT was consulted for evaluation.   Medical Lines Foley Catheter;PIV Line;Telemetry   Respiratory Status room air   Existing Precautions/Restrictions no known precautions/limitations   Mutuality/Individual Preferences   Anxieties, Fears or Concerns None voiced.   Individualized Care Needs Assistance with ADLs, monitoring of vitals.   Patient-Specific Goals (Include Timeframe) Go home when able.   Plan of Care Reviewed With patient   Living Environment   Lives With alone   Living Arrangements house   Home Assessment: No Problems Identified   Home Accessibility stairs to enter home;stairs within home   Stairs Within Home, Primary   Stair Railings, Within Home, Primary other (see comments)  (first half LHR, second half RHR)   Number of Stairs, Within Home, Primary other (see comments)  (13)   Home Main Entrance   Stair Railings, Main Entrance railings on both sides of stairs   Number of Stairs, Main Entrance seven   Functional Level Prior   Ambulation 1 - assistive equipment   Transferring 0 - independent   Toileting 0 - independent   Dressing 0 - independent   Eating 0 - independent   Communication 0 - understands/communicates without difficulty   Swallowing 0-->swallows foods/liquids without difficulty   Prior Functional Level Comment Patient reports intermittent SPC use prior to admisison and being indepedent with ADLs. Son present to confirm PLOF.   Pre Treatment Status   Pre Treatment Patient Status Patient supine in bed   Support Present Pre Treatment  Family present   Communication Pre Treatment  Charge Nurse   Communication Pre Treatment Comment CN cleared patient for PT evaluation.   Cognitive Assessment/Interventions   Behavior/Mood Observations behavior  appropriate to situation, WNL/WFL   Orientation Status oriented to;person;place;situation   Attention WNL/WFL   Follows Commands WFL   Pre- Treatment Vital Signs   Pre-Treatment Heart Rate (beats/min) 68   Pre SpO2 (%) 98   O2 Delivery Pre Treatment room air   Pre-Treatment Pain   Pretreatment Pain Rating   (Unable to rate on NPRS)   Pre/Posttreatment Pain Comment R hip pain   RUE Assessment   RUE Assessment WFL- Within Functional Limits   LUE Assessment   LUE Assessment WFL- Within Functional Limits   RLE Assessment   RLE Assessment X-Exceptions   RLE Strength Grossly: 4-/5.   LLE Assessment   LLE Assessment X-Exceptions   LLE Strength Grossly: 4-/5.   Trunk Assessment   Trunk Assessment WFL-Within Functional Limits   Mobility Assessment/Training   Additional Documentation Bed Mobility Assessment/Treatment (Group);Transfer Assessment/Treatment (Group);Gait Assessment/Treatment (Group)   Bed Mobility Assessment/Treatment   Bed Mobility, Assistive Device bed rails;Head of Bed Elevated  Scoot/Bridge Independence independent   Sit to Supine, Independence stand-by assistance   Supine-Sit Independence minimum assist (75% patient effort)   Transfer Assessment/Treatment   Sit-Stand-Sit, Assist Device walker, front wheeled   Sit-Stand Independence contact guard assist   Stand-Sit Independence contact guard assist   Gait Assessment/Treatment   Total Distance Ambulated 270   Assistive Device  walker, front wheeled   Distance in Feet 270   Independence  contact guard assist   Post Treatment Status   Post Treatment Patient Status Patient supine in bed   Support Present Post Treatment  Family present   Communication Post Treatement Nurse   Patient Effort excellent   Post-Treatment Vital Signs   Post-treatment Heart Rate (beats/min) 104   Post SpO2 (%) 98   O2 Delivery Post Treatment room air   Post-Treatment Pain   Posttreatment Pain Rating   (Unable to rate)   Physical Therapy Clinical Impression   Assessment Patient is an 82  year old male admitted 8/16 due to rhabdomyolysis. Patient was agreeable to PT evaluation upon arrival this date. Patient completed supine > sitting EOB requiring minimal assistance and STS requiring CGA using FWW. Patient ambulated 270' using FWW requiring CGA. Patient returned to bed following ambulation attempt from sitting EOB > supine requiring SBA. Patient is a good candidate for skilled PT intervention during hospital admisison to improve strength, balance, endurance, and independence with functional mobility.   Criteria for Skilled Therapeutic yes   Impairments Found (describe specific impairments) aerobic capacity/endurance;gait, locomotion, and balance;posture;muscle performance   Functional Limitations in Following  self-care;home management;community/leisure   Rehab Potential fair   Therapy Frequency other (see comments)  (1-2x/day at least one day weekly.)   Predicted Duration of Therapy Intervention (days/wks) until discharge   Anticipated Equipment Needs at Discharge (PT) front wheeled walker   Anticipated Discharge Disposition home with assist;home with home health  (Must have assist initially upon d/c to go home at this time.)   Evaluation Complexity Justification   Patient History: Co-morbidity/factors that impact Plan of Care 3 or more that impact Plan of Care   Examination Components 3 or more Exam elements addressed   Presentation Evolving: Symptoms, complaints, characteristics of condition changing &/or cognitive deficits present   Clinical Decision Making Moderate complexity   Evaluation Complexity Moderate complexity   Care Plan Goals   PT Rehab Goals Bed Mobility Goal;Gait Training Goal;Transfer Training Goal;Stairs Training Goal   Planned Therapy Interventions, PT Eval   Planned Therapy Interventions (PT) balance training;bed mobility training;home exercise program;gait training;patient/family education;postural re-education;stair training;ROM (range of motion);transfer  training;stretching;strengthening   Transfer Training Goal   Transfer Training Goal, Date Established 01/16/23   Transfer Training Goal, Time to Achieve by discharge   Transfer Training Goal, Activity Type all transfers   Transfer Training Goal, Current Status contact guard assist   Transfer Training Goal, Independence Level modified independence   Transfer Training Goal, Assist Device least restrictrictive assistive device   Gait Training  Goal, Distance to Achieve   Gait Training  Goal, Date Established 01/16/23   Gait Training  Goal, Time to Achieve by discharge   Gait Training  Goal, Independence Level modified independence   Gait Training  Goal, Assist Device least restricted assistive device   Gait Training  Goal, Distance to Achieve 350   Bed Mobility Goal   Bed Mobility Goal, Date Established 01/16/23   Bed Mobility Goal, Time to Achieve by discharge   Bed Mobility Goal, Activity Type all bed mobility activities  Bed Mobility Goal, Current Status other (see comments)  (Patient required CGA for supine > sitting EOB and SBA for sitting EOB > supine.)   Bed Mobility Goal, Independence Level independent   Bed Mobility Goal, Assistive Device least restrictive assistive device   Stairs Training Goal   Stairs Training Goal, Date Established 01/16/23   Stairs Training Goal, Time to Achieve by discharge   Stairs Training Goal, Independence Level modified independence   Stairs Training Goal, Current Status other (see comments)  (not assessed)   Stairs Training Goal, Assist Device least restrictive assistive device   Stairs Training Goal, Number of Stairs to Achieve 13   Physical Therapy Time and Intention   Total PT Minutes: 22       INTERVENTION MINUTES: EVALUATION 22 minutes    EVALUATION COMPLEXITY : CLINICAL DECISION MAKING OF MODERATE COMPLEXITY AS INDICATED BY PMHX, PHYSICAL THERAPY ASSESSMENT OF MUSCULOSKELETAL AND NEUROLOGICAL SYSTEMS AND ACTIVITY LIMITATIONS. CLINICAL PRESENTATION HAS CHANGING  CHARACTERISTICS.    Therapist:     Phineas Douglas, PT  01/16/2023, 11:21

## 2023-01-16 NOTE — Progress Notes (Signed)
[facility]   Progress Note    Jesse Cooper.  Date of service: 01/16/2023  Date of Admission:  01/13/2023  Hospital Day:  LOS: 3 days       HPI  82 year old white male was brought to the emergency room for evaluation after a fall.  He lives alone  Patient has dementia but currently is alert oriented x3 awake and appropriate however he does not recall falling  His son found him face down in the floor  Last time they saw him was 2 days ago so no one really knows how long he was in the floor  He denies any headache visual problems slurred speech or focal weakness  Denies any chest neck arm back or jaw pain  Denies palpitations or dizziness  There was no incontinence of bowel or bladder control  No reported seizure activity  He is on Eliquis for chronic atrial fibrillation  He denies any complaints of pain related to the fall    Subjective: No acute events overnight, HD stable, AAOx3    Review of Systems:    Review of Systems   Constitutional:  Negative for chills and fever.   HENT:  Negative for ear pain and sore throat.    Eyes:  Negative for pain and visual disturbance.   Respiratory:  Negative for cough and shortness of breath.    Cardiovascular:  Negative for chest pain and palpitations.   Gastrointestinal:  Negative for abdominal pain and vomiting.   Genitourinary:  Negative for dysuria and hematuria.   Musculoskeletal:  Negative for arthralgias and back pain.   Skin:  Negative for color change and rash.   Neurological:  Negative for seizures and syncope.   All other systems reviewed and are negative.      Objective:      Vital Signs:  Vitals:    01/15/23 2023 01/15/23 2207 01/16/23 0012 01/16/23 0827   BP: (!) 141/99  (!) 160/65 (!) 148/83   Pulse: 99 61 70 62   Resp: 17  17 18    Temp: 36.9 C (98.4 F)  36.9 C (98.4 F) 36.9 C (98.5 F)   SpO2: 95%  98% 98%   Weight:       Height:       BMI:                I/O:  I/O last 24 hours:    Intake/Output Summary (Last 24 hours) at 01/16/2023  1113  Last data filed at 01/16/2023 1006  Gross per 24 hour   Intake 720 ml   Output 1450 ml   Net -730 ml         acetaminophen (TYLENOL) tablet, 650 mg, Oral, Q4H PRN  alcohol 62 % (NOZIN NASAL SANITIZER) nasal solution, 1 Each, Each Nostril, 2x/day  [Held by provider] apixaban (ELIQUIS) tablet, 2.5 mg, Oral, 2x/day  carvedilol (COREG) tablet, 12.5 mg, Oral, 2x/day-Food  donepezil (ARICEPT) tablet, 5 mg, Oral, NIGHTLY  finasteride (PROSCAR) tablet, 5 mg, Oral, Daily before Breakfast  folic acid (FOLVITE) tablet, 1 mg, Oral, Daily  ipratropium-albuterol 0.5 mg-3 mg(2.5 mg base)/3 mL Solution for Nebulization, 3 mL, Nebulization, Q4H PRN  magnesium hydroxide (MILK OF MAGNESIA) 400mg  per 5mL oral liquid, 30 mL, Oral, HS PRN  memantine (NAMENDA) tablet, 10 mg, Oral, Daily  NS flush syringe, 3 mL, Intracatheter, Q8HRS  NS flush syringe, 3 mL, Intracatheter, Q1H PRN  NS premix infusion, , Intravenous, Continuous  ondansetron (ZOFRAN) 2 mg/mL injection,  4 mg, Intravenous, Q6H PRN  potassium & sodium phosphate (K PHOS NEUTRAL) tablet, 500 mg, Oral, 3x/day-Meals  pyridOXINE Vitamin B6 tablet, 100 mg, Oral, Daily  tamsulosin (FLOMAX) capsule, 0.4 mg, Oral, Daily  thiamine-vitamin B1 tablet, 100 mg, Oral, Daily        Physical Exam:    Physical Exam  Vitals and nursing note reviewed.   Constitutional:       General: He is not in acute distress.     Appearance: He is well-developed.   HENT:      Head: Normocephalic and atraumatic.   Eyes:      Conjunctiva/sclera: Conjunctivae normal.   Cardiovascular:      Rate and Rhythm: Normal rate and regular rhythm.      Heart sounds: No murmur heard.  Pulmonary:      Effort: Pulmonary effort is normal. No respiratory distress.      Breath sounds: Normal breath sounds.   Abdominal:      Palpations: Abdomen is soft.      Tenderness: There is no abdominal tenderness.   Musculoskeletal:         General: No swelling.      Cervical back: Neck supple.   Skin:     General: Skin is warm and dry.       Capillary Refill: Capillary refill takes less than 2 seconds.   Neurological:      Mental Status: He is alert.   Psychiatric:         Mood and Affect: Mood normal.         Labs:  Results for orders placed or performed during the hospital encounter of 01/13/23 (from the past 24 hour(s))   URINALYSIS WITH REFLEX MICROSCOPIC AND CULTURE IF POSITIVE [BLF ONLY] *Canceled*    Specimen: Urine, Indwelling Catheter    Narrative    The following orders were created for panel order URINALYSIS WITH REFLEX MICROSCOPIC AND CULTURE IF POSITIVE [BLF ONLY].  Procedure                               Abnormality         Status                     ---------                               -----------         ------                       Please view results for these tests on the individual orders.   URINALYSIS, MACROSCOPIC AND MICROSCOPIC NO CULTURE REFLEX [PRN ONLY]    Narrative    The following orders were created for panel order URINALYSIS, MACROSCOPIC AND MICROSCOPIC NO CULTURE REFLEX [PRN ONLY].  Procedure                               Abnormality         Status                     ---------                               -----------         ------  URINALYSIS, MACROSCOPIC[[id number]]      Abnormal            Final result               URINALYSIS, MICROSCOPIC[[id number]]      Abnormal            Final result                 Please view results for these tests on the individual orders.   URINALYSIS, MACROSCOPIC   Result Value Ref Range    COLOR Red (A) Colorless, Light Yellow, Yellow    APPEARANCE Ex. Turbid (A) Clear    SPECIFIC GRAVITY 1.021 1.002 - 1.030    PH 5.5 5.0 - 9.0    LEUKOCYTES 75 (A) Negative, 100  WBCs/uL    NITRITE Negative Negative    PROTEIN 70 (A) Negative, 10 , 20  mg/dL    GLUCOSE 010 (A) Negative, 30  mg/dL    KETONES Negative Negative, Trace mg/dL    BILIRUBIN Negative Negative, 0.5 mg/dL    BLOOD 1.0 (A) Negative, 0.03 mg/dL    UROBILINOGEN Normal Normal mg/dL    Narrative    Substances  that cause abnormal urine color may affect the readability of the urinalysis reagent strips. The color development on the reagant pad may be masked and could be interpreted as a false positive.     URINALYSIS, MICROSCOPIC   Result Value Ref Range    RBCS 29,792 (H) <4 /hpf    WBCS 87 (H) <6 /hpf   CBC/DIFF    Narrative    The following orders were created for panel order CBC/DIFF.  Procedure                               Abnormality         Status                     ---------                               -----------         ------                     CBC WITH UVOZ[[id number]]                Abnormal            Final result                 Please view results for these tests on the individual orders.   BASIC METABOLIC PANEL   Result Value Ref Range    SODIUM 143 136 - 145 mmol/L    POTASSIUM 3.6 3.5 - 5.1 mmol/L    CHLORIDE 118 (H) 98 - 107 mmol/L    CO2 TOTAL 22 21 - 31 mmol/L    ANION GAP 3 (L) 4 - 13 mmol/L    CALCIUM 8.1 (L) 8.6 - 10.3 mg/dL    GLUCOSE 425 (H) 74 - 109 mg/dL    BUN 14 7 - 25 mg/dL    CREATININE 9.56 3.87 - 1.30 mg/dL    BUN/CREA RATIO 23 (H) 6 - 22    ESTIMATED GFR 96 >59 mL/min/1.24m^2    OSMOLALITY, CALCULATED 287 270 - 290 mOsm/kg    Narrative    Estimated  Glomerular Filtration Rate (eGFR) is calculated using the CKD-EPI (2021) equation, intended for patients 54 years of age and older. If gender is not documented or "unknown", there will be no eGFR calculation.     MAGNESIUM   Result Value Ref Range    MAGNESIUM 2.0 1.9 - 2.7 mg/dL   CBC WITH DIFF   Result Value Ref Range    WBC 3.8 3.6 - 10.2 x10^3/uL    RBC 3.96 (L) 4.06 - 5.63 x10^6/uL    HGB 13.4 12.5 - 16.3 g/dL    HCT 25.9 56.3 - 87.5 %    MCV 99.8 (H) 73.0 - 96.2 fL    MCH 33.7 (H) 23.8 - 33.4 pg    MCHC 33.8 32.5 - 36.3 g/dL    RDW 64.3 32.9 - 51.8 %    PLATELETS 77 (L) 140 - 440 x10^3/uL    MPV 8.3 7.4 - 11.4 fL    NEUTROPHIL % 60 43 - 77 %    LYMPHOCYTE % 30 16 - 44 %    MONOCYTE % 7 5 - 13 %    EOSINOPHIL % 2 1 - 8 %    BASOPHIL %  1 0 - 1 %    NEUTROPHIL # 2.30 1.85 - 7.80 x10^3/uL    LYMPHOCYTE # 1.20 1.00 - 3.00 x10^3/uL    MONOCYTE # 0.30 0.30 - 1.00 x10^3/uL    EOSINOPHIL # 0.10 0.00 - 0.50 x10^3/uL    BASOPHIL # 0.00 0.00 - 0.10 x10^3/uL   PHOSPHORUS   Result Value Ref Range    PHOSPHORUS 2.3 (L) 3.7 - 7.2 mg/dL   SCAN DIFFERENTIAL   Result Value Ref Range    RBC MORPHOLOGY COMMENT Normal     PLATELET MORPHOLOGY COMMENT Decreased             Imaging:             Microbiology:  Hospital Encounter on 01/13/23 (from the past 96 hour(s))   ADULT ROUTINE BLOOD CULTURE, SET OF 2 ADULT BOTTLES (BACTERIA AND YEAST)    Collection Time: 01/13/23  7:25 PM    Specimen: Blood   Culture Result Status    BLOOD CULTURE, ROUTINE No Growth 2 Days Preliminary   ADULT ROUTINE BLOOD CULTURE, SET OF 2 ADULT BOTTLES (BACTERIA AND YEAST)    Collection Time: 01/13/23  7:25 PM    Specimen: Blood   Culture Result Status    BLOOD CULTURE, ROUTINE No Growth 2 Days Preliminary           Assessment/ Plan:   Active Hospital Problems    Diagnosis    Primary Problem: Rhabdomyolysis    ** FALL  ** FACIAL CONTUSIONS  ** RHABDOMYOLYSIS  ** GENERALIZED WEAKNESS  -dementia  -chronic persistent atrial fibrillation (on Eliquis)  C/w IV fluids  Creatine kinase trending down  Renal function stable  Coreg   Eliquis  Memantine  Donepezil    Urinary retention  - failed void trial  - Foley inserted  - urology consult pending  - flomax    PT eval pending        Disposition Planning:   PT eval    Genella Mech, MD    This note was partially generated using MModal Fluency Direct system, and there may be some incorrect words, spellings, and punctuation that were not noted in checking the note before saving.

## 2023-01-16 NOTE — Care Plan (Signed)
Problem: Adult Inpatient Plan of Care  Goal: Patient-Specific Goal (Individualized)  Recent Flowsheet Documentation  Taken 01/16/2023 0800 by Anne Fu, RN  Patient would like to participate in bedside shift report: Yes  Individualized Care Needs: assist w/ ADLs as needed, IV fluid hydration, monitor I&O  Anxieties, Fears or Concerns: none voiced at this time  Patient-Specific Goals (Include Timeframe): d/c when appropriate  Plan of Care Reviewed With: patient  Goal: Absence of Hospital-Acquired Illness or Injury  Intervention: Identify and Manage Fall Risk  Recent Flowsheet Documentation  Taken 01/16/2023 0800 by Anne Fu, RN  Safety Promotion/Fall Prevention:   activity supervised   fall prevention program maintained   nonskid shoes/slippers when out of bed   safety round/check completed  Intervention: Prevent Skin Injury  Recent Flowsheet Documentation  Taken 01/16/2023 1200 by Anne Fu, RN  Body Position: side lying, right  Taken 01/16/2023 1100 by Anne Fu, RN  Skin Protection:   adhesive use limited   tubing/devices free from skin contact  Taken 01/16/2023 0954 by Anne Fu, RN  Body Position: supine, head elevated  Taken 01/16/2023 0800 by Anne Fu, RN  Body Position: supine, head elevated  Intervention: Prevent and Manage VTE (Venous Thromboembolism) Risk  Recent Flowsheet Documentation  Taken 01/16/2023 0800 by Anne Fu, RN  VTE Prevention/Management: ambulation promoted  Goal: Optimal Comfort and Wellbeing  Intervention: Provide Person-Centered Care  Recent Flowsheet Documentation  Taken 01/16/2023 0800 by Anne Fu, RN  Trust Relationship/Rapport:   care explained   questions answered   other (see comments)     Problem: Skin Injury Risk Increased  Goal: Skin Health and Integrity  Intervention: Optimize Skin Protection  Recent Flowsheet Documentation  Taken 01/16/2023 1100 by Anne Fu, RN  Pressure Reduction Techniques:   Patient turned q 2 hours    Moisture, shear and nutrition are maximized   Frequent weight shifting encouraged  Pressure Reduction Devices: Repositioning wedges/pillows utilized  Skin Protection:   adhesive use limited   tubing/devices free from skin contact     Problem: Fall Injury Risk  Goal: Absence of Fall and Fall-Related Injury  Intervention: Promote Injury-Free Environment  Recent Flowsheet Documentation  Taken 01/16/2023 0800 by Anne Fu, RN  Safety Promotion/Fall Prevention:   activity supervised   fall prevention program maintained   nonskid shoes/slippers when out of bed   safety round/check completed     Problem: Alcohol Withdrawal  Goal: Readiness for Change Identified  Intervention: Promote Psychosocial Wellbeing  Recent Flowsheet Documentation  Taken 01/16/2023 0800 by Anne Fu, RN  Family/Support System Care:   involvement promoted   support provided

## 2023-01-16 NOTE — Care Plan (Addendum)
Medical Nutrition Therapy Assessment    Reason for assessment: consult: family request recommendations for supplements    SUBJECTIVE : Spoke with patient.  No family present.  Patient lives alone.  He is noted to have dementia but was able to provide what appeared to be an appropriate diet recall.  He states that he once weighed 259 lbs.  He's intentionally lost weight (and sometimes regained) at various times by following a lower carbohydrate diet.  He states that he consumes milk daily with cereal for breakfast and includes protein mostly in the form of meat for lunch and dinner.  He finishes dinner by 5 pm then sometimes has a snack before bed. Patient states that he drinks 1 beer daily with dinner.    OBJECTIVE:   PMH includes:  pleural effusion  Current Diet Order/Nutrition Support:  DIET REGULAR Do you want to initiate MNT Protocol? Yes    Height Used for Calculations: 177.8 cm (5\' 10" )  Weight Used For Calculations: 74.3 kg (163 lb 12.8 oz)  BMI (kg/m2): 23.55  Ideal Body Weight (IBW) (kg): 76.48  % Ideal Body Weight: 97.15    Estimated Needs:  Energy Calorie Requirements: 2229 kcal/day (30 kcal/kg)  Protein Requirements (gms/day): 74 gm/day (1.0 gm/kg)    Comments: Admission problem includes rhabdomyolysis and fall at home.  Plan for discharge home today.  Per patient's diet recall, he appears to generally have adequate PO intake with 3 meals a day.  He appropriately has had intentional weight loss.  This is okay as long as he continues to have balanced intake during times of focusing on consuming less carbohydrates/calories.   If patient has periods of poor PO intake excluding protein containing foods, fruit and vegetables, then an Ensure or similar supplement would be appropriate.      Nadyne Coombes, RDLD

## 2023-01-17 ENCOUNTER — Telehealth (HOSPITAL_COMMUNITY): Payer: Self-pay | Admitting: Family

## 2023-01-17 NOTE — Telephone Encounter (Signed)
Transition of Care Contact Information  Discharge Date: 01/16/2023  Transition Facility Type--Hospital (Inpatient or Observation)  Facility Name--[facility]  Interactive Contact(s): Completed or attempted contact indicated by Date/Time  First Attempt Call: 01/17/2023  3:31 PM  Second Attempted Contact: 01/17/2023  3:34 PM  Contact Method(s)-- Patient/Caregiver Telephone, MyChart Patient Portal  Clinical Staff Name/Role who contacted--Kazaria Gaertner, RN  Transition Note:Attempt #1--left a message with name, number and reason for calling. AVS reviewed. Encouraged a return call for questions/concerns or assistance as needed. Reminded of upcoming appts--dates/times/providers reviewed. Return to ED precautions given along with options for non-emergent issues/concerns/sx. Nurse Navigator toll free number, resources available, and hours of operation provided to patient and/or caregiver.   Attempt #2--My Chart message sent. Dinah Beers, RN          Dinah Beers, RN

## 2023-01-18 ENCOUNTER — Emergency Department (HOSPITAL_COMMUNITY): Payer: Medicare Other

## 2023-01-18 ENCOUNTER — Emergency Department
Admission: EM | Admit: 2023-01-18 | Discharge: 2023-01-18 | Disposition: A | Discharge status: Home / Self Care | Payer: Medicare Other | Attending: Family Medicine | Admitting: Family Medicine

## 2023-01-18 ENCOUNTER — Other Ambulatory Visit: Payer: Self-pay

## 2023-01-18 ENCOUNTER — Encounter (HOSPITAL_COMMUNITY): Payer: Self-pay

## 2023-01-18 DIAGNOSIS — R188 Other ascites: Secondary | ICD-10-CM | POA: Insufficient documentation

## 2023-01-18 DIAGNOSIS — I4510 Unspecified right bundle-branch block: Secondary | ICD-10-CM | POA: Insufficient documentation

## 2023-01-18 DIAGNOSIS — K279 Peptic ulcer, site unspecified, unspecified as acute or chronic, without hemorrhage or perforation: Secondary | ICD-10-CM | POA: Insufficient documentation

## 2023-01-18 DIAGNOSIS — R310 Gross hematuria: Secondary | ICD-10-CM | POA: Insufficient documentation

## 2023-01-18 DIAGNOSIS — J90 Pleural effusion, not elsewhere classified: Secondary | ICD-10-CM | POA: Insufficient documentation

## 2023-01-18 DIAGNOSIS — I482 Chronic atrial fibrillation, unspecified: Secondary | ICD-10-CM | POA: Insufficient documentation

## 2023-01-18 DIAGNOSIS — R9431 Abnormal electrocardiogram [ECG] [EKG]: Secondary | ICD-10-CM | POA: Insufficient documentation

## 2023-01-18 DIAGNOSIS — R932 Abnormal findings on diagnostic imaging of liver and biliary tract: Secondary | ICD-10-CM | POA: Insufficient documentation

## 2023-01-18 DIAGNOSIS — N390 Urinary tract infection, site not specified: Secondary | ICD-10-CM | POA: Insufficient documentation

## 2023-01-18 DIAGNOSIS — R319 Hematuria, unspecified: Secondary | ICD-10-CM

## 2023-01-18 DIAGNOSIS — R911 Solitary pulmonary nodule: Secondary | ICD-10-CM | POA: Insufficient documentation

## 2023-01-18 LAB — URINALYSIS, MACROSCOPIC
BILIRUBIN: NEGATIVE mg/dL
GLUCOSE: NEGATIVE mg/dL
KETONES: NEGATIVE mg/dL
LEUKOCYTES: 75 WBCs/uL — AB
NITRITE: NEGATIVE
PH: 6 (ref 5.0–9.0)
PROTEIN: 100 mg/dL — AB
SPECIFIC GRAVITY: 1.025 (ref 1.002–1.030)
UROBILINOGEN: NORMAL mg/dL

## 2023-01-18 LAB — TROPONIN-I
TROPONIN I: 22 ng/L — ABNORMAL HIGH (ref <20)
TROPONIN I: 20 ng/L — ABNORMAL HIGH (ref <20)

## 2023-01-18 LAB — SCAN DIFFERENTIAL: PLATELET MORPHOLOGY COMMENT: DECREASED

## 2023-01-18 LAB — CBC WITH DIFF
BASOPHIL #: 0 10*3/uL (ref 0.00–0.10)
BASOPHIL %: 1 % (ref 0–1)
EOSINOPHIL #: 0.1 10*3/uL (ref 0.00–0.50)
EOSINOPHIL %: 3 % (ref 1–8)
HCT: 40.4 % (ref 36.7–47.1)
HGB: 13.7 g/dL (ref 12.5–16.3)
LYMPHOCYTE #: 1.1 10*3/uL (ref 1.00–3.00)
LYMPHOCYTE %: 24 % (ref 16–44)
MCH: 34.1 pg — ABNORMAL HIGH (ref 23.8–33.4)
MCHC: 33.8 g/dL (ref 32.5–36.3)
MCV: 100.9 fL — ABNORMAL HIGH (ref 73.0–96.2)
MONOCYTE #: 0.3 10*3/uL (ref 0.30–1.00)
MONOCYTE %: 6 % (ref 5–13)
MPV: 8.4 fL (ref 7.4–11.4)
NEUTROPHIL #: 3 10*3/uL (ref 1.85–7.80)
NEUTROPHIL %: 67 % (ref 43–77)
PLATELETS: 94 10*3/uL — ABNORMAL LOW (ref 140–440)
RBC: 4.01 10*6/uL — ABNORMAL LOW (ref 4.06–5.63)
RDW: 13.6 % (ref 12.1–16.2)
WBC: 4.5 10*3/uL (ref 3.6–10.2)

## 2023-01-18 LAB — COMPREHENSIVE METABOLIC PANEL, NON-FASTING
ALBUMIN/GLOBULIN RATIO: 1.6 — ABNORMAL HIGH (ref 0.8–1.4)
ALBUMIN: 4.2 g/dL (ref 3.5–5.7)
ALKALINE PHOSPHATASE: 60 U/L (ref 34–104)
ALT (SGPT): 59 U/L — ABNORMAL HIGH (ref 7–52)
ANION GAP: 9 mmol/L (ref 4–13)
AST (SGOT): 52 U/L — ABNORMAL HIGH (ref 13–39)
BILIRUBIN TOTAL: 0.8 mg/dL (ref 0.3–1.0)
BUN/CREA RATIO: 21 (ref 6–22)
BUN: 15 mg/dL (ref 7–25)
CALCIUM, CORRECTED: 9.1 mg/dL (ref 8.9–10.8)
CALCIUM: 9.3 mg/dL (ref 8.6–10.3)
CHLORIDE: 109 mmol/L — ABNORMAL HIGH (ref 98–107)
CO2 TOTAL: 23 mmol/L (ref 21–31)
CREATININE: 0.7 mg/dL (ref 0.60–1.30)
ESTIMATED GFR: 93 mL/min/{1.73_m2} (ref >59)
GLOBULIN: 2.7 — ABNORMAL LOW (ref 2.9–5.4)
GLUCOSE: 109 mg/dL (ref 74–109)
OSMOLALITY, CALCULATED: 283 mOsm/kg (ref 270–290)
POTASSIUM: 3.6 mmol/L (ref 3.5–5.1)
PROTEIN TOTAL: 6.9 g/dL (ref 6.4–8.9)
SODIUM: 141 mmol/L (ref 136–145)

## 2023-01-18 LAB — CREATINE KINASE (CK), TOTAL, SERUM OR PLASMA: CREATINE KINASE: 298 U/L — ABNORMAL HIGH (ref 30–223)

## 2023-01-18 LAB — URINALYSIS, MICROSCOPIC: RBCS: 3696 /hpf — ABNORMAL HIGH (ref <4)

## 2023-01-18 LAB — ADULT ROUTINE BLOOD CULTURE, SET OF 2 BOTTLES (BACTERIA AND YEAST)
BLOOD CULTURE, ROUTINE: NO GROWTH
BLOOD CULTURE, ROUTINE: NO GROWTH

## 2023-01-18 LAB — GRAY TOP TUBE

## 2023-01-18 LAB — ECG 12 LEAD
Calculated R Axis: 270 degrees
QTC Calculation: 456 ms

## 2023-01-18 LAB — PT/INR
INR: 1.35 — ABNORMAL HIGH (ref 0.84–1.10)
PROTHROMBIN TIME: 15.8 seconds — ABNORMAL HIGH (ref 9.8–12.7)

## 2023-01-18 LAB — PTT (PARTIAL THROMBOPLASTIN TIME): APTT: 29.4 seconds (ref 25.0–38.0)

## 2023-01-18 MED ORDER — CEFTRIAXONE 1 GRAM SOLUTION FOR INJECTION
INTRAMUSCULAR | Status: AC
Start: 2023-01-18 — End: 2023-01-18
  Filled 2023-01-18: qty 10

## 2023-01-18 MED ORDER — SODIUM CHLORIDE 0.9 % INTRAVENOUS PIGGYBACK
1.0000 g | INJECTION | INTRAVENOUS | Status: AC
Start: 2023-01-18 — End: 2023-01-18
  Administered 2023-01-18: 1 g via INTRAVENOUS
  Administered 2023-01-18: 0 g via INTRAVENOUS

## 2023-01-18 MED ORDER — SODIUM CHLORIDE 0.9 % INTRAVENOUS PIGGYBACK
INJECTION | INTRAVENOUS | Status: AC
Start: 2023-01-18 — End: 2023-01-18
  Filled 2023-01-18: qty 50

## 2023-01-18 MED ORDER — CEPHALEXIN 500 MG CAPSULE
500.0000 mg | ORAL_CAPSULE | Freq: Two times a day (BID) | ORAL | 0 refills | Status: DC
Start: 2023-01-18 — End: 2023-01-23

## 2023-01-18 NOTE — ED Nurses Note (Signed)
REC TO ED 18 AMBULATORY FROM TRIAGE [facility] AND PT HAS FOLEY CATH FROM His LAST VISIT, He WAS RECENTLY IN THE HOSPITAL FROM A FALL AND ENDED UP REQUIRING FOLEY CATH FOR URINARY RETENTION,PT IS SUPPOSED TO FOLLOW UP W/CUTRONE, BUT THIS AM He SAYS THAT He STARTED HAVING HEMATURIA, URINE IS CHERRY RED, W/FEW CLOTS HOWEVER PT STILL IS ON A BLOOD THINNER FOR ATRIAL FIB, PT DENIES PULLING OR TUGGING ON CATH, IS C/O LOWER BACK PAIN, OTHERWISE NO C/O, PER THE DAUGHTER THE HOME HEALTH NURSE IS THE ONE THAT TOLD THEM TO COME, SAID PT LOOKED PALE, His COLOR IS GOOD, AND BP IS GOOD, CALL BELL PLACED AT BS

## 2023-01-18 NOTE — ED Triage Notes (Signed)
Blood in urine. Has foley in place 01/13/2023, in ER and admitted. Was discharged on Monday and urine became bloody this AM.

## 2023-01-18 NOTE — ED Provider Notes (Addendum)
[facility]  ED Primary Provider Note  Patient Name: Jesse Cooper.  Patient Age: 82 y.o.  Date of Birth: [birth year]    Chief Complaint: Hematuria        History of Present Illness       Jesse Cooper. is a 82 y.o. male who had concerns including Hematuria.  PATIENT PRESENTED TO THE EMERGENCY DEPARTMENT WITH COMPLAINTS OF BLOOD IN HIS CATHETER.  PATIENT WAS RECENTLY ADMITTED TO THIS FACILITY AND FOLEY CATHETER WAS PLACED AT THAT TIME.  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  FAMILY MEMBER DOES NOTE THAT URINE WAS DARKER YESTERDAY, BUT APPEARS TO BE BLOODY TODAY.  SHE WAS NOTED A FEW SMALL CLOTS, BUT CATHETER HAS BEEN DRAINING APPROPRIATELY.  PATIENT DENIES ANY ABDOMINAL PAIN OR FEVER AT HOME.  FAMILY WAS ABLE TO CONTACT PATIENT'S UROLOGIST OFFICE AND THEY WERE INSTRUCTED TO PRESENT TO THE EMERGENCY DEPARTMENT FOR EVALUATION.  PATIENT AND FAMILY MEMBERS DENY ANY FURTHER CONCERNS.  PATIENT WAS RESTING COMFORTABLY.        Review of Systems     No other overt Review of Systems are noted to be positive except noted in the HPI.      Historical Data   History Reviewed This Encounter: Medical History  Surgical History  Family History  Social History      Physical Exam   ED Triage Vitals [01/18/23 1523]   BP (Non-Invasive) (!) 148/86   Heart Rate (!) 45   Respiratory Rate 18   Temperature (!) 35.8 C (96.5 F)   SpO2 98 %   Weight 73.9 kg (163 lb)   Height 1.778 m (5\' 10" )         Nursing notes reviewed for what could be assessed. Past Medical, Surgical, and Social history reviewed for what has been completed.     Constitutional: NAD. Well-Developed. Well Nourished.  AFEBRILE.  Head: Normocephalic, atraumatic.  Mouth/Throat: no nasal discharge  Eyes: EOM grossly intact, conjunctiva normal.  Cardiovascular:  REGULAR RATE WITH AN IRREGULAR RHYTHM.  Pulmonary/Chest: No respiratory distress. Lungs are symmetric to auscultation bilaterally.  Abdominal: Soft,  non-tender, non-distended. Non peritoneal, no rebound, no guarding.  NO BLADDER TENDERNESS TO PALPATION OR DISTENTION NOTED ON EXAMINATION.  FOLEY CATHETER APPEARS TO BE FLOWING WELL.  THERE IS GROSS HEMATURIA NOTED IN THE BAG, BUT NO EVIDENCE OF OBSTRUCTION.  MSK: No Lower Extremity Edema.  Skin: Warm, dry, and intact  Neuro: Appropriate, CN II-XII grossly intact   Psych: Cooperative           Procedures      Patient Data     Labs Ordered/Reviewed   COMPREHENSIVE METABOLIC PANEL, NON-FASTING - Abnormal; Notable for the following components:       Result Value    CHLORIDE 109 (*)     ALT (SGPT) 59 (*)     AST (SGOT) 52 (*)     ALBUMIN/GLOBULIN RATIO 1.6 (*)     GLOBULIN 2.7 (*)     All other components within normal limits    Narrative:     Estimated Glomerular Filtration Rate (eGFR) is calculated using the CKD-EPI (2021) equation, intended for patients 77 years of age and older. If gender is not documented or "unknown", there will be no eGFR calculation.     PT/INR - Abnormal; Notable for the following components:    PROTHROMBIN TIME 15.8 (*)     INR 1.35 (*)     All other components  within normal limits    Narrative:     INR OF 2.0-3.0  RECOMMENDED FOR: PROPHYLAXIS/TREATMENT OF VENEOUS THROMBOSIS, PULMONARY EMBOLISM, PREVENTION OF SYSTEMIC EMBOLISM FROM ATRIAL FIBRILATION, MYOCARDIAL INFARCTION.    INR OF 2.5-3.5  RECOMMENDED FOR MECHANICAL PROSTHETIC HEART VALVES, RECURRENT SYSTEMIC EMBOLISM, RECURRENT MYOCARDIAL INFARCTION.     TROPONIN-I - Abnormal; Notable for the following components:    TROPONIN I 22 (*)     All other components within normal limits   CBC WITH DIFF - Abnormal; Notable for the following components:    RBC 4.01 (*)     MCV 100.9 (*)     MCH 34.1 (*)     PLATELETS 94 (*)     All other components within normal limits   URINALYSIS, MACROSCOPIC - Abnormal; Notable for the following components:    COLOR Red (*)     APPEARANCE Turbid (*)     LEUKOCYTES 75 (*)     PROTEIN 100 (*)     BLOOD 3+ (*)      All other components within normal limits    Narrative:     Substances that cause abnormal urine color may affect the readability of the urinalysis reagent strips. The color development on the reagant pad may be masked and could be interpreted as a false positive.     URINALYSIS, MICROSCOPIC - Abnormal; Notable for the following components:    RBCS 3,696 (*)     All other components within normal limits   CREATINE KINASE (CK), TOTAL, SERUM OR PLASMA - Abnormal; Notable for the following components:    CREATINE KINASE 298 (*)     All other components within normal limits   TROPONIN-I - Abnormal; Notable for the following components:    TROPONIN I 20 (*)     All other components within normal limits   PTT (PARTIAL THROMBOPLASTIN TIME) - Normal   URINE CULTURE,ROUTINE   CBC/DIFF    Narrative:     The following orders were created for panel order CBC/DIFF.  Procedure                               Abnormality         Status                     ---------                               -----------         ------                     CBC WITH ZOXW[[id number]]                Abnormal            Final result                 Please view results for these tests on the individual orders.   URINALYSIS, MACROSCOPIC AND MICROSCOPIC W/CULTURE REFLEX    Narrative:     The following orders were created for panel order URINALYSIS, MACROSCOPIC AND MICROSCOPIC W/CULTURE REFLEX.  Procedure                               Abnormality         Status                     ---------                               -----------         ------  URINALYSIS, MACROSCOPIC[[id number]]      Abnormal            Final result               URINALYSIS, MICROSCOPIC[[id number]]      Abnormal            Final result                 Please view results for these tests on the individual orders.   EXTRA TUBES    Narrative:     The following orders were created for panel order EXTRA TUBES.  Procedure                               Abnormality          Status                     ---------                               -----------         ------                     Jesse Cooper                                    Final result                 Please view results for these tests on the individual orders.   GRAY TOP TUBE   SCAN DIFFERENTIAL       CT RENAL CALCULI SERIES WO IV CONTRAST   Final Result by Edi, Radresults In (08/21 1814)   THERE IS NO FINDING ON THIS EXAM TO EXPLAIN HEMATURIA.      FINDINGS OF FLUID OVERLOAD/ANASARCA. THERE IS A RIGHT GREATER THAN LEFT PLEURAL EFFUSION. THERE IS SUBCUTANEOUS EDEMA. THERE IS ASCITES AS WELL AS PRESACRAL EDEMA.      PULMONARY NODULE IN THE RIGHT LOWER LOBE. NONEMERGENT CT OF THE CHEST SHOULD BE PERFORMED TO EVALUATE FOR ANY OTHER NODULES.      ABNORMAL GALLBLADDER. THIS IS CONTRACTED AND CHOLECYSTITIS IS UNLIKELY AS A RESULT. WALL THICKENING MAY BE RELATED TO A HYPOALBUMINEMIC STATE.      POTENTIAL PEPTIC ULCER DISEASE.         One or more dose reduction techniques were used (e.g., Automated exposure control, adjustment of the mA and/or kV according to patient size, use of iterative reconstruction technique).         Radiologist location ID: [facility]             Medical Decision Making          Medical Decision Making        Studies Assessed:  LAB WORK, IMAGING, EKG    EKG:   This EKG interpreted by me shows:    Rate: 78    Interpretation:  RIGHT SUPERIOR AXIS DEVIATION, ATRIAL FIBRILLATION, RATE 78, NONSPECIFIC ST-T WAVE CHANGES      MDM Narrative:  PATIENT PRESENTED TO THE EMERGENCY DEPARTMENT WITH COMPLAINTS OF BLOOD IN HIS CATHETER.  PATIENT WAS RECENTLY ADMITTED TO THIS FACILITY AND FOLEY CATHETER WAS PLACED AT THAT TIME.  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  FAMILY MEMBER  DOES NOTE THAT URINE WAS DARKER YESTERDAY, BUT APPEARS TO BE BLOODY TODAY.  SHE WAS NOTED A FEW SMALL CLOTS, BUT CATHETER HAS BEEN DRAINING APPROPRIATELY.  PATIENT DENIES ANY ABDOMINAL PAIN OR FEVER AT HOME.   FAMILY WAS ABLE TO CONTACT PATIENT'S UROLOGIST OFFICE AND THEY WERE INSTRUCTED TO PRESENT TO THE EMERGENCY DEPARTMENT FOR EVALUATION.  PATIENT AND FAMILY MEMBERS DENY ANY FURTHER CONCERNS.  PATIENT WAS RESTING COMFORTABLY.  PHYSICAL EXAMINATION DOES REVEAL OBVIOUS GROSS HEMATURIA IN THE FOLEY CATHETER BAG.  CATHETER APPEARS TO BE DRAINING WELL AND THERE IS NO EVIDENCE OF OBSTRUCTION.  BLADDER IS NOT DISTENDED OR TENDER TO PALPATION ON EXAM.  PATIENT DOES HAVE AN IRREGULAR RHYTHM WITH A REGULAR RATE AND A KNOWN HISTORY OF CHRONIC ATRIAL FIBRILLATION.  PHYSICAL EXAMINATION WAS OTHERWISE UNREMARKABLE IN HIS PATIENT WITH DEMENTIA.  LAB WORK AND IMAGING ORDERED.  PATIENT WAS STABLE.      ED Course as of 01/18/23 1854   Wed Jan 18, 2023   1649 WBC 4.5, HEMOGLOBIN 13.7, PLATELET COUNT 94, SODIUM 141, POTASSIUM 3.6, BUN 15, CREATININE 0.70, GLUCOSE 109, TOTAL BILIRUBIN 0.8, AST 52, ALT 59, TOTAL ALKALINE PHOSPHATASE 60, TROPONIN 22, PTT 29.4, PT 15.8, INR 1.35   1649 REPEAT TROPONIN ORDERED   1649 URINALYSIS REVEALED 75 LEUKOCYTES AND 3+ BLOOD WITH 3696 RED BLOOD CELLS   1706 I WAS ABLE TO DISCUSS THIS PATIENT WITH OUR UROLOGIST, DR. Fredonia Highland.  HE WAS ABLE TO REVIEW THE CHART AND DID RECOMMEND INITIATION OF ANTIBIOTICS.   1714 CT RENAL STUDY WAS ORDERED   1737 ON REEXAMINATION, PATIENT WAS RESTING COMFORTABLY AND IN NO APPARENT DISTRESS.  HE STATES THAT HE WAS READY TO GO HOME.  LAB FINDINGS WERE DISCUSSED WITH THE PATIENT AND FAMILY MEMBER AT BEDSIDE.  ALL QUESTIONS WERE ANSWERED TO SATISFACTION.  PATIENT IS GOING FOR CT IMAGING AT THIS TIME.   1745 REPEAT TROPONIN WAS 20.  UPON REVIEW OF THE CHART, TROPONIN WAS ACTUALLY MUCH HIGHER BEFORE.  PATIENT HAD WAY DENIES ANY CHEST PAIN.  3 HOUR TROPONIN WAS CANCELED.   1817 CT RENAL STUDY REVEALED:   FINDINGS:  Noncontrast technique limits evaluation of the abdominal and pelvic viscera.     Lung bases: There is a moderate right and small left pleural effusion. There is coronary  artery calcification. There is compressive atelectasis from the effusions on both lower lobes.     There is a 7 mm pulmonary nodule in the right lower lobe. CT of the chest performed to evaluate for any other nodules.     Liver:   Unremarkable.     Gallbladder:   Contracted and poorly evaluated. There is some wall thickening present. This likely is related to a hypoalbuminemic state given underlying ascites.     Spleen:   Unremarkable.     Pancreas:   Unremarkable.     Adrenals:   Unremarkable.     Kidneys:   There are at least 4 left renal cysts. There are no renal calculi or any hydronephrosis.     Bladder:  Decompressed with a Foley catheter present. The bladder is poorly evaluated.     Prostate:  Enlarged.     Bowel:   Colonic diverticulosis without diverticulitis. There is some questionable wall thickening of the antrum/proximal duodenum. There is either some ascites or adjacent inflammatory change.     The gallbladder disease is not excluded.     Appendix:  Normal.     Lymph nodes:  No suspicious lymph node enlargement.  Vasculature:   Major vascular structures are unremarkable.      Peritoneum / Retroperitoneum: There is a small volume of ascites. There is no free air.     Bones:   There is diffuse osteopenia and degenerative change.     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 The hernia on the right contains loops of small bowel without obstruction. The hernia on the left contains fat and ascites.     There are findings of anasarca with third spacing of fluid in the subcutaneous tissues as well as in the presacral space.   1817 IMPRESSION:  THERE IS NO FINDING ON THIS EXAM TO EXPLAIN HEMATURIA.     FINDINGS OF FLUID OVERLOAD/ANASARCA. THERE IS A RIGHT GREATER THAN LEFT PLEURAL EFFUSION. THERE IS SUBCUTANEOUS EDEMA. THERE IS ASCITES AS WELL AS PRESACRAL EDEMA.     PULMONARY NODULE IN THE RIGHT LOWER LOBE. NONEMERGENT CT OF THE CHEST SHOULD BE PERFORMED TO EVALUATE FOR ANY OTHER NODULES.     ABNORMAL  GALLBLADDER. THIS IS CONTRACTED AND CHOLECYSTITIS IS UNLIKELY AS A RESULT. WALL THICKENING MAY BE RELATED TO A HYPOALBUMINEMIC STATE.     POTENTIAL PEPTIC ULCER DISEASE.   1819 TOTAL BILIRUBIN AND LIVER ENZYMES ARE ACTUALLY BETTER TODAY THAN THEY WERE PREVIOUSLY BEFORE HE WAS DISCHARGED.   1828 ON REEXAMINATION, PATIENT WAS RESTING COMFORTABLY AND IN NO APPARENT DISTRESS.  PATIENT AND FAMILY MEMBER WERE COUNSELED AND EDUCATED ON LABORATORY AND IMAGING FINDINGS.  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  PATIENT DENIES ANY SHORTNESS OF BREATH AND OXYGEN SATURATION WAS 98%.  THEY WERE COUNSELED AND EDUCATED ON THE PULMONARY NODULES, AS WELL AS CONCERN FOR PEPTIC ULCER DISEASE AND THICKENING OF THE GALLBLADDER WALL.  PATIENT WILL BE REFERRED TO GASTROENTEROLOGY TO FOLLOW UP ON AN OUTPATIENT BASIS.  PATIENT WAS TO FOLLOW UP WITH DR. Fredonia Highland IN THE NEXT 1-2 DAYS FOR RE-EVALUATION AND CYSTOSCOPY ON AN OUTPATIENT BASIS.  THEY WERE GIVEN VERY CLEAR INSTRUCTIONS TO RETURN TO THE EMERGENCY DEPARTMENT FOR ANY NEW OR WORSENING SYMPTOMS, INCLUDING CONCERN FOR OBSTRUCTION OR PAIN.  PATIENT AND FAMILY MEMBER VERBALIZED UNDERSTANDING.  PATIENT WAS SMILING AND STABLE AT TIME OF DISCHARGE.  ALL QUESTIONS WERE ANSWERED TO SATISFACTION.   1850 A REFERRAL FOR PULMONOLOGY WAS ALSO PROVIDED, AS PATIENT'S PULMONOLOGIST IS NO LONGER IN THE AREA.  PATIENT WILL NEED TO FOLLOW UP FOR PLEURAL EFFUSION AND PULMONARY NODULES.  PATIENT AND FAMILY MEMBER VERBALIZED UNDERSTANDING.   1854 CK 298.  THIS HAS SIGNIFICANTLY IMPROVED FROM PREVIOUS LAB WORK.         Medications Administered in the ED   cefTRIAXone (ROCEPHIN) 1 g in NS 50 mL IVPB minibag (1 g Intravenous New Bag/New Syringe 01/18/23 1715)       Following the history, physical exam, and ED workup, the patient was deemed stable and suitable for  discharge. The patient/caregiver was advised to return to the ED for any new or worsening symptoms. Discharge medications, and follow-up instructions were discussed with the patient/caregiver in detail, who verbalizes understanding. The patient/caregiver is in agreement and is comfortable with the plan of care.    Disposition: Discharged         Current Discharge Medication List        START taking these medications.        Details   cephalexin 500 mg Capsule  Commonly known as: KEFLEX  500 mg, Oral, 2 TIMES DAILY  Qty: 20 Capsule  Refills: 0            CONTINUE these medications - NO CHANGES were made during your visit.        Details   albuterol sulfate 90 mcg/actuation oral inhaler  Commonly known as: PROVENTIL or VENTOLIN or PROAIR   2 Puffs, Inhalation, EVERY 6 HOURS PRN  Qty: 6.7 Each  Refills: 1     carvediloL 25 mg Tablet  Commonly known as: COREG   25 mg, Oral, 2 TIMES DAILY  Refills: 0     cholecalciferol (vitamin D3) 1,250 mcg (50,000 unit) Capsule   TAKE ONE CAPSULE BY MOUTH ONCE EVERY SEVEN DAYS  Qty: 13 Capsule  Refills: 1     Eliquis 5 mg Tablet  Generic drug: apixaban   5 mg, Oral, 2 TIMES DAILY  Refills: 0     finasteride 5 mg Tablet  Commonly known as: PROSCAR   5 mg, Oral, DAILY  Qty: 90 Tablet  Refills: 3     fluticasone furoate-vilanteroL 100-25 mcg/dose Disk with Device  Commonly known as: Breo Ellipta   1 Inhalation, Inhalation, DAILY  Qty: 3 Each  Refills: 1     folic acid 1 mg Tablet  Commonly known as: FOLVITE   1 mg, Oral, DAILY  Qty: 90 Tablet  Refills: 0     k phos di & mono-sod phos mono 250 mg Tablet  Commonly known as: NEUTRA-PHOS   250 mg, Oral, 2 TIMES DAILY  Qty: 20 Tablet  Refills: 0     losartan 100 mg Tablet  Commonly known as: COZAAR   100 mg, Oral, DAILY  Qty: 90 Tablet  Refills: 1     Magnesium 250 mg Tablet   250 mg, Oral, DAILY  Refills: 0     memantine 10 mg Tablet  Commonly known as: NAMENDA   TAKE 1/2 TABLET BY MOUTH ONCE EVERY DAY at bedtime FOR 7 DAYS THEN TAKE 1/2  TABLET TWICE DAILY FOR 7 DAYS THEN TAKE 1/2 TABLET EVERY MORNING AND TAKE ONE TABLET AT BEDTIME FOR 7 DAYS THEN TAKE ONE TABLET TWICE DAILY THEREAFTER  Refills: 0     multivitamin Tablet  Commonly known as: SUPER THERA VITE M   1 Tablet, Oral  Refills: 0     pyridoxine 100 mg Tablet  Commonly known as: VITAMIN B6   100 mg, Oral, DAILY  Qty: 30 Tablet  Refills: 0     rivastigmine 1.5 mg Capsule  Commonly known as: EXELON   1.5 mg, Oral, 2 TIMES DAILY WITH FOOD  Refills: 0     rosuvastatin 20 mg Tablet  Commonly known as: CRESTOR   20 mg, Oral, DAILY  Qty: 90 Tablet  Refills: 1     tamsulosin 0.4 mg Capsule  Commonly known as: FLOMAX   0.4 mg, Oral, EVERY EVENING AFTER DINNER  Qty: 90 Capsule  Refills: 3     thiamine mononitrate 100 mg Tablet   100 mg, Oral, DAILY  Qty: 30 Tablet  Refills: 0            Follow up:   Stormy Fabian, NP-C  [facility]  [address]  [place] [address]  [phone]    In 1 day      [facility]  [address]  [place] [address]  [phone]    As needed, If symptoms worsen  Arlana Hove, DO  [address]  [place] [address]  [phone]    In 1 day      Rogers Seeds, MD  [address]  [place] [address]  [phone]    In 1 day      Renaye Rakers, MD  [address]  [place] [address]  [phone]    In 1 week                 Clinical Impression   Hematuria, unspecified type (Primary)   Pleural effusion   Chronic atrial fibrillation (CMS HCC)   Peptic ulcer disease   Ascites   Urinary tract infection   Pulmonary nodule         Current Discharge Medication List        START taking these medications    Details   cephalexin (KEFLEX) 500 mg Oral Capsule Take 1 Capsule (500 mg total) by mouth Twice daily for 10 days  Qty: 20 Capsule, Refills: 0               Tawanna Sat, DO

## 2023-01-18 NOTE — ED Nurses Note (Signed)
Pt. D/C home with family, discharge instructions given to pt., all questions answered. Pt. Encouraged to follow up with PCP as indicated. IV Removed, pressure dressing applied. Bleeding controlled. Pt. Left ED via ambulation with assistive device and encouraged to come back if symptoms worsen.

## 2023-01-19 ENCOUNTER — Telehealth (HOSPITAL_COMMUNITY): Payer: Self-pay | Admitting: Family

## 2023-01-19 NOTE — Telephone Encounter (Signed)
Post Ed Follow-Up    Post ED Follow-Up:   Document completed and/or attempted interactive contact(s) after transition to home after emergency department stay.:   Transition Facility and relevant Date:   Discharge Date: 01/18/23  Discharge from [facility] Emergency Department?: Yes  Discharge Facility: [facility]  Contacted by: Glorianne Manchester, RN  Contact method: Patient/Caregiver Telephone, MyChart Patient Portal  Contact first attempt: 01/19/2023 10:30 AM

## 2023-01-20 ENCOUNTER — Encounter (INDEPENDENT_AMBULATORY_CARE_PROVIDER_SITE_OTHER): Payer: Self-pay | Admitting: Student in an Organized Health Care Education/Training Program

## 2023-01-20 ENCOUNTER — Ambulatory Visit (INDEPENDENT_AMBULATORY_CARE_PROVIDER_SITE_OTHER): Payer: Medicare Other | Admitting: Student in an Organized Health Care Education/Training Program

## 2023-01-20 ENCOUNTER — Other Ambulatory Visit: Payer: Self-pay

## 2023-01-20 VITALS — BP 148/94 | HR 79 | Ht 70.5 in | Wt 186.0 lb

## 2023-01-20 DIAGNOSIS — R319 Hematuria, unspecified: Secondary | ICD-10-CM

## 2023-01-20 DIAGNOSIS — R339 Retention of urine, unspecified: Secondary | ICD-10-CM

## 2023-01-20 DIAGNOSIS — Z09 Encounter for follow-up examination after completed treatment for conditions other than malignant neoplasm: Secondary | ICD-10-CM

## 2023-01-20 DIAGNOSIS — N281 Cyst of kidney, acquired: Secondary | ICD-10-CM

## 2023-01-20 DIAGNOSIS — N401 Enlarged prostate with lower urinary tract symptoms: Secondary | ICD-10-CM

## 2023-01-20 DIAGNOSIS — N400 Enlarged prostate without lower urinary tract symptoms: Secondary | ICD-10-CM

## 2023-01-20 DIAGNOSIS — Z7901 Long term (current) use of anticoagulants: Secondary | ICD-10-CM

## 2023-01-20 DIAGNOSIS — R310 Gross hematuria: Secondary | ICD-10-CM

## 2023-01-20 DIAGNOSIS — R338 Other retention of urine: Secondary | ICD-10-CM

## 2023-01-20 LAB — URINE CULTURE,ROUTINE: URINE CULTURE: NO GROWTH

## 2023-01-20 NOTE — Progress Notes (Unsigned)
UROLOGY, [facility]  [address] [place] [address]    Progress Note    Name: Jesse Cooper. MRN:  [id number]   Date: 01/20/2023 DOB:  [birth year] (82 y.o.)             Chief Complaint: New Patient [facility] consult follow up)  Subjective   Subjective:   Jesse Urda. is a 82 y.o., White male who presents with a fall.  He was then found to have experienced urinary retention and a Foley catheter was placed.  He then failed a voiding trial thereafter and a catheter was replaced.  He was not on any home BPH medications.  He denies a history of urologic surgery.  He denies a history of urinary tract infections.  He denies a personal or family history of urologic malignancy.          Objective   Objective :  BP (!) 148/94 (Site: Right Arm, Patient Position: Sitting, Cuff Size: Adult)   Pulse 79   Ht 1.791 m (5' 10.5")   Wt 84.4 kg (186 lb)   BMI 26.31 kg/m       Gen: NAD, alert  Pulm: unlabored at rest  CV: palpable pulses  Abd: soft, Nt/ND  GU: no suprapubic tenderness, no CVAT        Data reviewed:    Current Outpatient Medications   Medication Sig    albuterol sulfate (PROVENTIL OR VENTOLIN OR PROAIR) 90 mcg/actuation Inhalation oral inhaler Take 2 Puffs by inhalation Every 6 hours as needed    carvediloL (COREG) 25 mg Oral Tablet Take 1 Tablet (25 mg total) by mouth Twice daily    cephalexin (KEFLEX) 500 mg Oral Capsule Take 1 Capsule (500 mg total) by mouth Twice daily for 10 days    cholecalciferol, vitamin D3, 1,250 mcg (50,000 unit) Oral Capsule TAKE ONE CAPSULE BY MOUTH ONCE EVERY SEVEN DAYS    ELIQUIS 5 mg Oral Tablet Take 1 Tablet (5 mg total) by mouth Twice daily    finasteride (PROSCAR) 5 mg Oral Tablet Take 1 Tablet (5 mg total) by mouth Once a day    fluticasone furoate-vilanteroL (BREO ELLIPTA) 100-25 mcg/dose Inhalation Disk with Device Take 1 Inhalation by inhalation Once a day for 90 days    folic acid (FOLVITE) 1 mg Oral Tablet Take 1 Tablet (1 mg total) by mouth  Once a day for 90 days    k phos di & mono-sod phos mono (NEUTRA-PHOS) 250 mg Oral Tablet Take 1 Tablet (250 mg total) by mouth Twice daily for 10 days    losartan (COZAAR) 100 mg Oral Tablet Take 1 Tablet (100 mg total) by mouth Once a day    Magnesium 250 mg Oral Tablet Take 1 Tablet (250 mg total) by mouth Once a day    memantine (NAMENDA) 10 mg Oral Tablet TAKE 1/2 TABLET BY MOUTH ONCE EVERY DAY at bedtime FOR 7 DAYS THEN TAKE 1/2 TABLET TWICE DAILY FOR 7 DAYS THEN TAKE 1/2 TABLET EVERY MORNING AND TAKE ONE TABLET AT BEDTIME FOR 7 DAYS THEN TAKE ONE TABLET TWICE DAILY THEREAFTER    multivitamin (SUPER THERA VITE M) Oral Tablet Take 1 Tablet by mouth    pyridoxine, vitamin B6, (VITAMIN B6) 100 mg Oral Tablet Take 1 Tablet (100 mg total) by mouth Once a day for 30 days    rivastigmine (EXELON) 1.5 mg Oral Capsule Take 1 Capsule (1.5 mg total) by mouth Twice daily with  food    rosuvastatin (CRESTOR) 20 mg Oral Tablet Take 1 Tablet (20 mg total) by mouth Once a day    tamsulosin (FLOMAX) 0.4 mg Oral Capsule Take 1 Capsule (0.4 mg total) by mouth Every evening after dinner    thiamine mononitrate 100 mg Oral Tablet Take 1 Tablet (100 mg total) by mouth Once a day for 30 days        Assessment/Plan  Problem List Items Addressed This Visit    None      Urinary retention, 1.2L         BPH/LUTS, on Flomax and finasteride; 135cc         Gross hematuria on Eliquis      Left renal cysts        Arlana Hove, DO

## 2023-01-23 ENCOUNTER — Other Ambulatory Visit: Payer: Self-pay

## 2023-01-23 ENCOUNTER — Encounter (RURAL_HEALTH_CENTER): Payer: Self-pay | Admitting: Family

## 2023-01-23 ENCOUNTER — Ambulatory Visit (RURAL_HEALTH_CENTER): Payer: Medicare Other | Attending: Family | Admitting: Family

## 2023-01-23 ENCOUNTER — Ambulatory Visit (INDEPENDENT_AMBULATORY_CARE_PROVIDER_SITE_OTHER): Payer: Self-pay

## 2023-01-23 VITALS — BP 122/64 | HR 75 | Temp 98.2°F | Ht 70.0 in | Wt 180.4 lb

## 2023-01-23 DIAGNOSIS — R339 Retention of urine, unspecified: Secondary | ICD-10-CM | POA: Insufficient documentation

## 2023-01-23 DIAGNOSIS — D696 Thrombocytopenia, unspecified: Secondary | ICD-10-CM | POA: Insufficient documentation

## 2023-01-23 DIAGNOSIS — R310 Gross hematuria: Secondary | ICD-10-CM | POA: Insufficient documentation

## 2023-01-23 DIAGNOSIS — Z09 Encounter for follow-up examination after completed treatment for conditions other than malignant neoplasm: Secondary | ICD-10-CM

## 2023-01-23 DIAGNOSIS — I4510 Unspecified right bundle-branch block: Secondary | ICD-10-CM

## 2023-01-23 DIAGNOSIS — I4891 Unspecified atrial fibrillation: Secondary | ICD-10-CM

## 2023-01-23 DIAGNOSIS — R9431 Abnormal electrocardiogram [ECG] [EKG]: Secondary | ICD-10-CM

## 2023-01-23 DIAGNOSIS — I214 Non-ST elevation (NSTEMI) myocardial infarction: Secondary | ICD-10-CM

## 2023-01-23 DIAGNOSIS — M6282 Rhabdomyolysis: Secondary | ICD-10-CM | POA: Insufficient documentation

## 2023-01-23 DIAGNOSIS — M5136 Other intervertebral disc degeneration, lumbar region: Secondary | ICD-10-CM | POA: Insufficient documentation

## 2023-01-23 HISTORY — DX: Encounter for follow-up examination after completed treatment for conditions other than malignant neoplasm: Z09

## 2023-01-23 LAB — ECG 12 LEAD
Calculated T Axis: -70 degrees
QRS Duration: 102 ms
QT Interval: 400 ms
Ventricular rate: 78 {beats}/min

## 2023-01-23 NOTE — Assessment & Plan Note (Signed)
Resolved

## 2023-01-23 NOTE — Progress Notes (Signed)
FAMILY MEDICINE, [facility]  [address]  [place] [address]  Operated by [facility]  Transitional Care Management Visit    Name: Jesse Cooper.  MRN: [id number]    Date: 01/23/2023  Age: 82 y.o.      Chief Complaint: Hospital Follow Up and Hospital Discharge Transition    History of Present Illness:  Jesse Cooper. is a 82 y.o. male is seen for transitional care management.    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    Past Surgical History:  He has a past surgical history that includes colonscopy bedside (N/A, 2012); Hand surgery (Left); and hx back surgery (N/A, 2011).    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    Medications:    albuterol sulfate    carvediloL    cholecalciferol (vitamin D3)    Eliquis    finasteride    fluticasone furoate-vilanteroL    folic acid    k  phos di & mono-sod phos mono    losartan    Magnesium    memantine    multivitamin    pyridoxine (vitamin B6)    rivastigmine    rosuvastatin    tamsulosin    thiamine mononitrate     Review of Systems:      Exam:   BP 122/64   Pulse 75   Temp 36.8 C (98.2 F)   Ht 1.778 m (5\' 10" )   Wt 81.8 kg (180 lb 6.4 oz)   BMI 25.88 kg/m   Physical Exam  Vitals reviewed.   Constitutional:       Appearance: Normal appearance.   Cardiovascular:      Rate and Rhythm: Normal rate. Rhythm irregular.      Pulses:           Radial pulses are 2+ on the right side and 2+ on the left side.      Heart sounds: Normal heart sounds.   Musculoskeletal:      Right lower leg: No edema.      Left lower leg: No edema.   Neurological:      Mental Status: He is alert.      Motor: Weakness present.      Coordination: Coordination abnormal.      Gait: Gait abnormal (walker in use).   Psychiatric:         Behavior: Behavior normal. Behavior is cooperative.         Cognition and Memory: Cognition normal.      e      Transition of Care Contact Information  Discharge date: Discharge Date: 01/16/2023  Transition Facility Type--Hospital (  Inpatient or Observation)  Facility Name--[facility] Interactive Contact(s):  First Attempt Call: 01/17/2023  3:31 PM  Second Attempted Contact: 01/17/2023  3:34 PM  Contact Method(s)-- Patient/Caregiver Telephone, MyChart Patient Portal  Clinical Staff Name/Role who contacted--Felecia Sine, RN     Data Reviewed  Medication Reconciliation completed     Assessment/Plan:    ICD-10-CM    1. Platelets decreased (CMS HCC)  D69.6       2. Hospital discharge follow-up  Z09       3. Rhabdomyolysis  M62.82       4. DDD (degenerative disc disease), lumbar  M51.36       5. Gross hematuria  R31.0       6. Urinary retention  R33.9          Problem List Items Addressed This Visit          Nephrology    Gross hematuria     Resolved.         Urinary retention     Catheter was removed from today by [facility], patient has not been able to urinate as of this visit.  Home Health will be following up this afternoon to confirm patient is urinating and if not, urinary catheter will be inserted again.  Managed by Dr. Fredonia Highland.  Patient will continue Flomax and tamsulosin as prescribed.            Hematologic/Lymphatic    Platelets decreased (CMS HCC) - Primary       Musculoskeletal    DDD (degenerative disc disease), lumbar     Patient is scheduled Home Health PT starting 01/24/2023.  Discussed Xray.  Patient will start Tylenol Arthritis PRN for chronic back pain.          Rhabdomyolysis     Resolved.            Other    Hospital discharge follow-up       Other transition actions (Optional) -: Discharge documentation was reviewed, Discharge documentation used to reconcile outpatient medication list, No pending tests or treatments., Education about medication(s) provided to patient/ family/ caregiver  , and Confirmed that home health agency has contacted patient     Return for 04/13/2023.     Stormy Fabian, NP-C

## 2023-01-23 NOTE — Assessment & Plan Note (Signed)
Catheter was removed from today by [facility], patient has not been able to urinate as of this visit.  Home Health will be following up this afternoon to confirm patient is urinating and if not, urinary catheter will be inserted again.  Managed by Dr. Fredonia Highland.  Patient will continue Flomax and tamsulosin as prescribed.

## 2023-01-23 NOTE — Assessment & Plan Note (Signed)
Patient is scheduled Home Health PT starting 01/24/2023.  Discussed Xray.  Patient will start Tylenol Arthritis PRN for chronic back pain.

## 2023-01-25 ENCOUNTER — Telehealth (RURAL_HEALTH_CENTER): Payer: Self-pay | Admitting: Family

## 2023-01-25 NOTE — Telephone Encounter (Signed)
Marchelle Folks, social worker with [facility], called and left message that she had been to see Jesse Cooper and made a referral to [employer] for him.

## 2023-01-26 DIAGNOSIS — I5043 Acute on chronic combined systolic (congestive) and diastolic (congestive) heart failure: Secondary | ICD-10-CM | POA: Insufficient documentation

## 2023-02-01 ENCOUNTER — Encounter (INDEPENDENT_AMBULATORY_CARE_PROVIDER_SITE_OTHER): Payer: Self-pay | Admitting: Student in an Organized Health Care Education/Training Program

## 2023-02-02 ENCOUNTER — Other Ambulatory Visit (HOSPITAL_COMMUNITY): Payer: Self-pay | Admitting: PULMONARY DISEASE

## 2023-02-02 DIAGNOSIS — I509 Heart failure, unspecified: Secondary | ICD-10-CM

## 2023-02-02 DIAGNOSIS — I5043 Acute on chronic combined systolic (congestive) and diastolic (congestive) heart failure: Secondary | ICD-10-CM

## 2023-02-08 NOTE — Care Management Notes (Signed)
Referral Information  ++++++ Placed Provider #1 ++++++  Case Manager: Lynn Thompson  Provider Type: Home Health  Provider Name: [facility] [place]  Address:  [address], [address]  [place], [place] [address]  Contact: Mary Werner    Phone: [phone] x  Fax:   Fax: [phone]

## 2023-02-08 NOTE — Care Management Notes (Signed)
Referral Information  ++++++ Placed Provider #1 ++++++  Case Manager: Rica Koyanagi  Provider Type: DME  Provider Name: [facility]  Address:  [address]  [place], [place] [id number]  Contact: [place] [facility]    Phone: [phone] x  Fax:   Fax: [phone]

## 2023-02-14 ENCOUNTER — Inpatient Hospital Stay
Admission: RE | Admit: 2023-02-14 | Discharge: 2023-02-14 | Disposition: A | Discharge status: Home / Self Care | Payer: Medicare Other | Source: Ambulatory Visit | Attending: PULMONARY DISEASE | Admitting: PULMONARY DISEASE

## 2023-02-14 ENCOUNTER — Ambulatory Visit (INDEPENDENT_AMBULATORY_CARE_PROVIDER_SITE_OTHER): Payer: Medicare Other | Admitting: Student in an Organized Health Care Education/Training Program

## 2023-02-14 ENCOUNTER — Other Ambulatory Visit: Payer: Self-pay

## 2023-02-14 ENCOUNTER — Other Ambulatory Visit (HOSPITAL_COMMUNITY): Payer: Self-pay | Admitting: PULMONARY DISEASE

## 2023-02-14 ENCOUNTER — Encounter (INDEPENDENT_AMBULATORY_CARE_PROVIDER_SITE_OTHER): Payer: Self-pay | Admitting: Student in an Organized Health Care Education/Training Program

## 2023-02-14 VITALS — BP 111/71 | HR 73 | Ht 70.5 in | Wt 175.0 lb

## 2023-02-14 DIAGNOSIS — J90 Pleural effusion, not elsewhere classified: Secondary | ICD-10-CM

## 2023-02-14 DIAGNOSIS — R319 Hematuria, unspecified: Secondary | ICD-10-CM

## 2023-02-14 DIAGNOSIS — I5043 Acute on chronic combined systolic (congestive) and diastolic (congestive) heart failure: Secondary | ICD-10-CM | POA: Insufficient documentation

## 2023-02-14 DIAGNOSIS — Z87448 Personal history of other diseases of urinary system: Secondary | ICD-10-CM

## 2023-02-14 DIAGNOSIS — I509 Heart failure, unspecified: Secondary | ICD-10-CM | POA: Insufficient documentation

## 2023-02-14 DIAGNOSIS — N401 Enlarged prostate with lower urinary tract symptoms: Secondary | ICD-10-CM

## 2023-02-14 DIAGNOSIS — R351 Nocturia: Secondary | ICD-10-CM

## 2023-02-14 DIAGNOSIS — N400 Enlarged prostate without lower urinary tract symptoms: Secondary | ICD-10-CM

## 2023-02-14 DIAGNOSIS — R339 Retention of urine, unspecified: Secondary | ICD-10-CM

## 2023-02-14 DIAGNOSIS — N281 Cyst of kidney, acquired: Secondary | ICD-10-CM

## 2023-02-14 DIAGNOSIS — R338 Other retention of urine: Secondary | ICD-10-CM

## 2023-02-14 NOTE — Progress Notes (Signed)
UROLOGY, [facility]  [address] [place] [address]    Progress Note    Name: Jesse Cooper. MRN:  [id number]   Date: 02/14/2023 DOB:  11-27-1940 (82 y.o.)             Chief Complaint: Follow Up  Subjective   Subjective:   Jesse Cooper. is a 82 y.o., White male who presents to ER with a fall.  He was then found to have experienced urinary retention and a Foley catheter was placed.  He then failed a voiding trial thereafter and a catheter was replaced.  He was not on any home BPH medications.  He denies a history of urologic surgery.  He denies a history of urinary tract infections.  He denies a personal or family history of urologic malignancy.      He presents to clinic today due to hematuria in his catheter.  The catheter is draining well.  He went to the ER the day prior and had a CT scan which was negative.  His catheter is draining well at this time with light pink urine.  He is on Eliquis.     He recently passed his voiding trial and presents today for follow up.  He continues to use finasteride and Flomax. He denies any fevers, chills, nausea, vomiting, flank pain, suprapubic pain, headache, chest pain, shortness of breath, vision changes.         Objective   Objective :  BP 111/71 (Site: Right Arm, Patient Position: Sitting, Cuff Size: Adult)   Pulse 73   Ht 1.791 m (5' 10.5")   Wt 79.4 kg (175 lb)   BMI 24.75 kg/m       Gen: NAD, alert  Pulm: unlabored at rest  CV: palpable pulses  Abd: soft, Nt/ND  GU: no suprapubic tenderness, no CVAT        Data reviewed:    Current Outpatient Medications   Medication Sig    albuterol sulfate (PROVENTIL OR VENTOLIN OR PROAIR) 90 mcg/actuation Inhalation oral inhaler Take 2 Puffs by inhalation Every 6 hours as needed    carvediloL (COREG) 25 mg Oral Tablet Take 1 Tablet (25 mg total) by mouth Twice daily    cholecalciferol, vitamin D3, 1,250 mcg (50,000 unit) Oral Capsule TAKE ONE CAPSULE BY MOUTH ONCE EVERY SEVEN DAYS    ELIQUIS 5  mg Oral Tablet Take 1 Tablet (5 mg total) by mouth Twice daily    finasteride (PROSCAR) 5 mg Oral Tablet Take 1 Tablet (5 mg total) by mouth Once a day    fluticasone furoate-vilanteroL (BREO ELLIPTA) 100-25 mcg/dose Inhalation Disk with Device Take 1 Inhalation by inhalation Once a day for 90 days    folic acid (FOLVITE) 1 mg Oral Tablet Take 1 Tablet (1 mg total) by mouth Once a day for 90 days    losartan (COZAAR) 100 mg Oral Tablet Take 1 Tablet (100 mg total) by mouth Once a day    Magnesium 250 mg Oral Tablet Take 1 Tablet (250 mg total) by mouth Once a day    memantine (NAMENDA) 10 mg Oral Tablet TAKE 1/2 TABLET BY MOUTH ONCE EVERY DAY at bedtime FOR 7 DAYS THEN TAKE 1/2 TABLET TWICE DAILY FOR 7 DAYS THEN TAKE 1/2 TABLET EVERY MORNING AND TAKE ONE TABLET AT BEDTIME FOR 7 DAYS THEN TAKE ONE TABLET TWICE DAILY THEREAFTER    multivitamin (SUPER THERA VITE M) Oral Tablet Take 1 Tablet by mouth  pyridoxine, vitamin B6, (VITAMIN B6) 100 mg Oral Tablet Take 1 Tablet (100 mg total) by mouth Once a day for 30 days    rivastigmine (EXELON) 1.5 mg Oral Capsule Take 1 Capsule (1.5 mg total) by mouth Twice daily with food    rosuvastatin (CRESTOR) 20 mg Oral Tablet Take 1 Tablet (20 mg total) by mouth Once a day    tamsulosin (FLOMAX) 0.4 mg Oral Capsule Take 1 Capsule (0.4 mg total) by mouth Every evening after dinner    thiamine mononitrate 100 mg Oral Tablet Take 1 Tablet (100 mg total) by mouth Once a day for 30 days        Assessment/Plan  Problem List Items Addressed This Visit          Urology    Benign prostatic hyperplasia, unspecified whether lower urinary tract symptoms present    Urinary retention     Other Visit Diagnoses       Renal cyst    -  Primary    Hematuria, unspecified type                  Urinary retention, 1.2L  PVR at each visit        BPH/LUTS, on Flomax and finasteride; 135cc  Discussed the differential diagnosis, pathophysiology and nature of benign prostate enlargement causing lower urinary  tract symptoms  Counseled patient on conservative management options including appropriate fluid management, avoidance of diuretics including caffeine and alcohol  Continue Finasteride (ProscarT) 5 mg P.O. daily:    I have discussed in great detail with the patient the treatment of prostate enlargement using finasteride.  I have explained my rationale for using finasteride as well as potential long-term risks of sexual dysfunction, including impotence (5.1%), decreased libido (2.6%), and decreased ejaculate volume (1.5%). I also explained the possibility of breast enlargement (1.8%) and/or tenderness (0.7%).  I did warn patient about the possibility of hypersensitivity reactions leading to rash, itching, hives, or swelling (0.5%).  He was instructed to call the office if any of these or any other possible reactions occurred.  I further advised patient that finasteride may cause an alteration in his PSA level and the FDA caution of a possible "increase in the risk of high-grade prostate cancer".  The patient expressed an understanding of the treatment, possible reactions, and possible prognosis.  Continue Tamsulosin Hydrochloride (FlomaxT) 0.4 mg P.O. daily:    I have discussed in great detail with the patient the treatment of prostate enlargement/lower urinary tract symptoms using tamsulosin.  I have explained my rationale for using tamsulosin as well as potential risks of asthenia (2%), dizziness (5%), rhinitis (3%) and abnormal ejaculation (11%). I encouraged patient to report any prior or current history of alpha-1 antagonist use to their opthalmic surgeon before undergoing any eye surgery due to the risk of intraoperative floppy iris syndrome.  The patient expressed an understanding of the treatment, possible reactions, and possible prognosis.   They will discuss cardiac clearance with their cardiologist at their next appointment          Gross hematuria on Eliquis  No further hematuria now that he is urinating  without catheter. No further workup at this time       Left renal cysts  I had a very lengthy discussion with patient regarding the characteristics, nature and work-up of their renal cyst, as well as the rationale, implications and alternatives of the various management options.  We spent considerable time personally reviewing relevant imaging which are  consistent with a simple renal cyst  I discussed the nature of incompletely characterized renal cyst warranting further dedicated imaging for further lesion characterization  I discussed the nature of simple appearing Bosniak I renal cyst which have been reported to affect more than 50% of patients over the age of 23 and warrant no further workup, surveillance or intervention if asymptomatic; however, repeat ultrasonography may be considered at 6 to 12 months in selected patients in order to confirm stability and a correct diagnosis  I discussed the nature of minimally complex Bosniak II renal cyst which pose limited oncologic potential and generally do not warrant further radiographic surveillance; however, repeat ultrasonography may be considered at 6 to 12 months in selected patients in order to confirm stability and a correct diagnosis  I discussed the nature of complex Bosniak IIF renal cyst which has a reported incidence of malignancy ranging from 5-20% and accordingly warrants further radiographic surveillance  I discussed the nature of complex Bosniak III renal cyst which carries nearly a 50% risk of malignancy and warrants surgical extirpation  I discussed the nature of complex Bosniak IV renal cyst which carries nearly a 90% risk of malignancy and warrants surgical extirpation  Based on patient's clinical and radiographic findings, I would recommend no further imaging at this time  Generally the indications for surgical intervention for renal cysts include suspected malignancy, pain attributable to cyst expansion, cyst infection and angiotensin-dependent  hypertension.  We also discussed the limited role of percutaneous renal fine-needle aspiration (FNA) biopsy; unfortunately, biopsy cannot reliably distinguish a benign renal cyst from the less common cystic renal cell carcinoma.  Preoperative needle biopsy has generally not been recommended for resectable renal lesions, because of concern about seeding of the peritoneum.      Nocturia  I discussed potential management strategies to limit patient's nocturia episodes including:  Appropriate fluid management  Appropriate timing of diuretics, if applicable  Limiting nighttime fluid intake  Elevation of patient's legs prior to sleep  Compression stockings  Limiting alcohol and caffeine intake        Arlana Hove, DO     A combined total of 35 minutes were spent preparing to see the patient, reviewing previous records, ordering tests/medications/procedures, documenting the clinical encounter as well as performing a medically appropriate evaluation and independently interpreting results and communicating them to the patient/family/caregiver as specifically outlined above in the impression and plan.    This note may have been fully or partially generated using MModal Fluency Direct system, and there may be some incorrect words, spellings, and punctuation that were not identified in checking the note before saving.

## 2023-02-22 ENCOUNTER — Other Ambulatory Visit (RURAL_HEALTH_CENTER): Payer: Self-pay | Admitting: Family

## 2023-04-03 ENCOUNTER — Ambulatory Visit (RURAL_HEALTH_CENTER): Payer: Medicare Other | Attending: Family | Admitting: Family

## 2023-04-03 ENCOUNTER — Other Ambulatory Visit: Payer: Self-pay

## 2023-04-03 ENCOUNTER — Encounter (RURAL_HEALTH_CENTER): Payer: Self-pay | Admitting: Family

## 2023-04-03 ENCOUNTER — Telehealth (RURAL_HEALTH_CENTER): Payer: Self-pay | Admitting: Family

## 2023-04-03 VITALS — BP 138/80 | HR 51 | Temp 98.4°F | Ht 70.0 in | Wt 177.0 lb

## 2023-04-03 DIAGNOSIS — Z7901 Long term (current) use of anticoagulants: Secondary | ICD-10-CM | POA: Insufficient documentation

## 2023-04-03 DIAGNOSIS — D696 Thrombocytopenia, unspecified: Secondary | ICD-10-CM | POA: Insufficient documentation

## 2023-04-03 DIAGNOSIS — N400 Enlarged prostate without lower urinary tract symptoms: Secondary | ICD-10-CM | POA: Insufficient documentation

## 2023-04-03 DIAGNOSIS — Z7409 Other reduced mobility: Secondary | ICD-10-CM | POA: Insufficient documentation

## 2023-04-03 DIAGNOSIS — L739 Follicular disorder, unspecified: Secondary | ICD-10-CM | POA: Insufficient documentation

## 2023-04-03 DIAGNOSIS — I110 Hypertensive heart disease with heart failure: Secondary | ICD-10-CM | POA: Insufficient documentation

## 2023-04-03 DIAGNOSIS — I5032 Chronic diastolic (congestive) heart failure: Secondary | ICD-10-CM | POA: Insufficient documentation

## 2023-04-03 DIAGNOSIS — E559 Vitamin D deficiency, unspecified: Secondary | ICD-10-CM | POA: Insufficient documentation

## 2023-04-03 DIAGNOSIS — I10 Essential (primary) hypertension: Secondary | ICD-10-CM | POA: Insufficient documentation

## 2023-04-03 DIAGNOSIS — E782 Mixed hyperlipidemia: Secondary | ICD-10-CM | POA: Insufficient documentation

## 2023-04-03 DIAGNOSIS — J449 Chronic obstructive pulmonary disease, unspecified: Secondary | ICD-10-CM | POA: Insufficient documentation

## 2023-04-03 DIAGNOSIS — I4891 Unspecified atrial fibrillation: Secondary | ICD-10-CM | POA: Insufficient documentation

## 2023-04-03 DIAGNOSIS — G4730 Sleep apnea, unspecified: Secondary | ICD-10-CM | POA: Insufficient documentation

## 2023-04-03 DIAGNOSIS — R7301 Impaired fasting glucose: Secondary | ICD-10-CM | POA: Insufficient documentation

## 2023-04-03 NOTE — Assessment & Plan Note (Signed)
Managed by Dr. Javed, Cardiologist.

## 2023-04-03 NOTE — Assessment & Plan Note (Signed)
Discussed fall risk.  Patient is ambulatory without a cane.  Has been doing well.  Drives.  No concerns voiced today.

## 2023-04-03 NOTE — Progress Notes (Signed)
[place] [facility]  [facility]      Jesse Cooper.  MRN: [id number]  DOB: 1940/11/28  Date of Service: 04/03/2023    CHIEF COMPLAINT  Chief Complaint   Patient presents with    Follow Up 6 Months     Follow up routine       SUBJECTIVE  Jesse Cooper. is a 82 y.o. male who presents to clinic for chronic disease management.  Patient is accompanied by daughter.  Patient is currently under the care of cardiologist, Dr. Renda Rolls;  Dr. Donell Sievert, Neurologist; and Dr. Fredonia Highland, Urologist. No concerns voiced today.       Review of Systems:  Positive ROS discussed in HPI, otherwise all other systems negative.      Medications:   acetaminophen (TYLENOL ARTHRITIS PAIN) 650 mg Oral Tablet Sustained Release, Take 1 Tablet (650 mg total) by mouth Twice daily  albuterol sulfate (PROVENTIL OR VENTOLIN OR PROAIR) 90 mcg/actuation Inhalation oral inhaler, Take 2 Puffs by inhalation Every 6 hours as needed  carvediloL (COREG) 25 mg Oral Tablet, Take 1 Tablet (25 mg total) by mouth Twice daily  casanthranol/docusate sodium (PERI-COLACE ORAL), Take by mouth 1 am and 2 pm  cholecalciferol, vitamin D3, 1,250 mcg (50,000 unit) Oral Capsule, TAKE ONE CAPSULE BY MOUTH ONCE EVERY SEVEN DAYS  ELIQUIS 5 mg Oral Tablet, Take 1 Tablet (5 mg total) by mouth Twice daily  ferrous sulfate (FERATAB) 324 mg (65 mg iron) Oral Tablet, Delayed Release (E.C.), Take 1 Tablet (324 mg total) by mouth Twice a day before meals  finasteride (PROSCAR) 5 mg Oral Tablet, Take 1 Tablet (5 mg total) by mouth Once a day  fluticasone furoate-vilanteroL (BREO ELLIPTA) 100-25 mcg/dose Inhalation Disk with Device, Take 1 Inhalation by inhalation Once a day for 90 days  folic acid (FOLVITE) 1 mg Oral Tablet, Take 1 Tablet (1 mg total) by mouth Once a day for 90 days  losartan (COZAAR) 100 mg Oral Tablet, TAKE ONE TABLET BY MOUTH ONCE EVERY DAY  magnesium oxide (MAG-OX) 400 mg Oral Tablet, Take 1 Tablet (400 mg total) by mouth Once a  day  memantine (NAMENDA) 10 mg Oral Tablet, TAKE 1/2 TABLET BY MOUTH ONCE EVERY DAY at bedtime FOR 7 DAYS THEN TAKE 1/2 TABLET TWICE DAILY FOR 7 DAYS THEN TAKE 1/2 TABLET EVERY MORNING AND TAKE ONE TABLET AT BEDTIME FOR 7 DAYS THEN TAKE ONE TABLET TWICE DAILY THEREAFTER  multivitamin (SUPER THERA VITE M) Oral Tablet, Take 1 Tablet by mouth  POTASSIUM-99 ORAL, Take 1 Tablet by mouth  rivastigmine (EXELON) 1.5 mg Oral Capsule, Take 1 Capsule (1.5 mg total) by mouth Twice daily with food  tamsulosin (FLOMAX) 0.4 mg Oral Capsule, Take 1 Capsule (0.4 mg total) by mouth Every evening after dinner  Magnesium 250 mg Oral Tablet, Take 1 Tablet (250 mg total) by mouth Once a day  rosuvastatin (CRESTOR) 20 mg Oral Tablet, Take 1 Tablet (20 mg total) by mouth Once a day (Patient not taking: Reported on 04/03/2023)    No facility-administered medications prior to visit.      Allergies:   Allergies   Allergen Reactions    Hydrocodone-Homatropine Itching         OBJECTIVE  BP 138/80 (Site: Left Arm, Patient Position: Sitting, Cuff Size: Adult)   Pulse 51   Temp 36.9 C (98.4 F) (Tympanic)   Ht 1.778 m (5\' 10" )   Wt 80.3 kg (177 lb)   SpO2 98%  BMI 25.40 kg/m       Physical Exam  Vitals reviewed.   Constitutional:       Appearance: Normal appearance.   Cardiovascular:      Rate and Rhythm: Normal rate. Rhythm irregular.      Pulses: Normal pulses.           Radial pulses are 2+ on the right side and 2+ on the left side.      Heart sounds: Normal heart sounds, S1 normal and S2 normal.   Pulmonary:      Effort: Pulmonary effort is normal.      Breath sounds: Normal breath sounds.   Abdominal:      General: Bowel sounds are normal.   Musculoskeletal:      Cervical back: Neck supple.      Right lower leg: No edema.      Left lower leg: No edema.   Skin:     General: Skin is warm.   Neurological:      General: No focal deficit present.      Mental Status: He is alert and oriented to person, place, and time. Mental status is at  baseline.      Cranial Nerves: Cranial nerves 2-12 are intact.      Motor: Weakness present.      Coordination: Coordination abnormal.      Gait: Gait abnormal.   Psychiatric:         Speech: Speech normal.         Behavior: Behavior normal. Behavior is cooperative.         Cognition and Memory: Cognition is impaired. Memory is impaired.         ASSESSMENT/PLAN  (I10) Essential hypertension  (primary encounter diagnosis)  Plan: HGA1C (HEMOGLOBIN A1C WITH EST AVG GLUCOSE),         CBC, COMPREHENSIVE METABOLIC PNL, FASTING,         LIPID PANEL, MAGNESIUM, THYROID STIMULATING         HORMONE (SENSITIVE TSH), VITAMIN D 25 TOTAL,         VITAMIN B12    (J44.9) COPD (chronic obstructive pulmonary disease) (CMS HCC)  Plan: HGA1C (HEMOGLOBIN A1C WITH EST AVG GLUCOSE),         CBC, COMPREHENSIVE METABOLIC PNL, FASTING,         LIPID PANEL, MAGNESIUM, THYROID STIMULATING         HORMONE (SENSITIVE TSH), VITAMIN D 25 TOTAL,         VITAMIN B12    (E78.2) Mixed hyperlipidemia  Plan: HGA1C (HEMOGLOBIN A1C WITH EST AVG GLUCOSE),         CBC, COMPREHENSIVE METABOLIC PNL, FASTING,         LIPID PANEL, MAGNESIUM, THYROID STIMULATING         HORMONE (SENSITIVE TSH), VITAMIN D 25 TOTAL,         VITAMIN B12    (I48.91) Atrial fibrillation (CMS HCC)  Plan: HGA1C (HEMOGLOBIN A1C WITH EST AVG GLUCOSE),         CBC, COMPREHENSIVE METABOLIC PNL, FASTING,         LIPID PANEL, MAGNESIUM, THYROID STIMULATING         HORMONE (SENSITIVE TSH), VITAMIN D 25 TOTAL,         VITAMIN B12    (G47.30) Sleep apnea  Plan: HGA1C (HEMOGLOBIN A1C WITH EST AVG GLUCOSE),         CBC, COMPREHENSIVE METABOLIC PNL, FASTING,  LIPID PANEL, MAGNESIUM, THYROID STIMULATING         HORMONE (SENSITIVE TSH), VITAMIN D 25 TOTAL,         VITAMIN B12    (R73.01) IFG (impaired fasting glucose)  Plan: HGA1C (HEMOGLOBIN A1C WITH EST AVG GLUCOSE),         CBC, COMPREHENSIVE METABOLIC PNL, FASTING,         LIPID PANEL, MAGNESIUM, THYROID STIMULATING         HORMONE  (SENSITIVE TSH), VITAMIN D 25 TOTAL,         VITAMIN B12    (E55.9) Vitamin D deficiency, unspecified  Plan: HGA1C (HEMOGLOBIN A1C WITH EST AVG GLUCOSE),         CBC, COMPREHENSIVE METABOLIC PNL, FASTING,         LIPID PANEL, MAGNESIUM, THYROID STIMULATING         HORMONE (SENSITIVE TSH), VITAMIN D 25 TOTAL,         VITAMIN B12    (E83.42) Hypomagnesemia  Plan: HGA1C (HEMOGLOBIN A1C WITH EST AVG GLUCOSE),         CBC, COMPREHENSIVE METABOLIC PNL, FASTING,         LIPID PANEL, MAGNESIUM, THYROID STIMULATING         HORMONE (SENSITIVE TSH), VITAMIN D 25 TOTAL,         VITAMIN B12    (Z79.01) Long term current use of anticoagulant therapy  Plan: HGA1C (HEMOGLOBIN A1C WITH EST AVG GLUCOSE),         CBC, COMPREHENSIVE METABOLIC PNL, FASTING,         LIPID PANEL, MAGNESIUM, THYROID STIMULATING         HORMONE (SENSITIVE TSH), VITAMIN D 25 TOTAL,         VITAMIN B12    (N40.0) Benign prostatic hyperplasia, unspecified whether lower urinary tract symptoms present    (I50.32) Chronic diastolic (congestive) heart failure (CMS HCC)    (D69.6) Platelets decreased (CMS HCC)    (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 Renda Rolls, Cardiologist.         Relevant Orders    HGA1C (HEMOGLOBIN A1C WITH EST AVG GLUCOSE)    CBC    COMPREHENSIVE METABOLIC PNL, FASTING    LIPID PANEL    MAGNESIUM    THYROID STIMULATING HORMONE (SENSITIVE TSH)    VITAMIN D 25 TOTAL    VITAMIN B12    Essential hypertension - Primary     BP stable and controlled, continue current medications.  Discontinue HCTZ 25 mg daily.         Relevant Orders    HGA1C (HEMOGLOBIN A1C WITH EST AVG GLUCOSE)    CBC    COMPREHENSIVE METABOLIC PNL, FASTING    LIPID PANEL    MAGNESIUM    THYROID STIMULATING HORMONE (SENSITIVE TSH)    VITAMIN D 25 TOTAL    VITAMIN B12    Mixed hyperlipidemia      Rosuvastatin 20 mg daily has been discontinued due to decreased kidney function at time of hospitalization in August 2024.          Relevant Orders    HGA1C (HEMOGLOBIN A1C WITH EST AVG GLUCOSE)    CBC    COMPREHENSIVE METABOLIC PNL, FASTING  LIPID PANEL    MAGNESIUM    THYROID STIMULATING HORMONE (SENSITIVE TSH)    VITAMIN D 25 TOTAL    VITAMIN B12    Chronic diastolic (congestive) heart failure (CMS HCC)     Patient is no longer taking Jardiance due to hx of fall and decreased kidney function.  Patient has not restarted.             Respiratory    Sleep apnea     Reports he quit using CPAP in Sept 2023, reports he is sleeping better.  No longer following with Pulmonology.          Relevant Orders    HGA1C (HEMOGLOBIN A1C WITH EST AVG GLUCOSE)    CBC    COMPREHENSIVE METABOLIC PNL, FASTING    LIPID PANEL    MAGNESIUM    THYROID STIMULATING HORMONE (SENSITIVE TSH)    VITAMIN D 25 TOTAL    VITAMIN B12    COPD (chronic obstructive pulmonary disease) (CMS HCC)     Continue Breo and albuterol PRN.   Does not recall last time he used albuterol         Relevant Orders    HGA1C (HEMOGLOBIN A1C WITH EST AVG GLUCOSE)    CBC    COMPREHENSIVE METABOLIC PNL, FASTING    LIPID PANEL    MAGNESIUM    THYROID STIMULATING HORMONE (SENSITIVE TSH)    VITAMIN D 25 TOTAL    VITAMIN B12       Endocrine    IFG (impaired fasting glucose)     Managing with diet and exercise.         Relevant Orders    HGA1C (HEMOGLOBIN A1C WITH EST AVG GLUCOSE)    CBC    COMPREHENSIVE METABOLIC PNL, FASTING    LIPID PANEL    MAGNESIUM    THYROID STIMULATING HORMONE (SENSITIVE TSH)    VITAMIN D 25 TOTAL    VITAMIN B12    Vitamin D deficiency, unspecified     Continue weekly vitamin D 50,000 IU         Relevant Orders    HGA1C (HEMOGLOBIN A1C WITH EST AVG GLUCOSE)    CBC    COMPREHENSIVE METABOLIC PNL, FASTING    LIPID PANEL    MAGNESIUM    THYROID STIMULATING HORMONE (SENSITIVE TSH)    VITAMIN D 25 TOTAL    VITAMIN B12       Hematologic/Lymphatic     Platelets decreased (CMS HCC)     Further treatment pending labs.            Urology    Benign prostatic hyperplasia, unspecified whether lower urinary tract symptoms present     Continue Tamsulosin 0.4mg  daily.            Dermatology    Chronic folliculitis     ampicillin 500 mg daily > 30 years, discontinued in August 2024 post hospitalization.  Folliculitis is controlled.            Other    Hypomagnesemia     Continue over-the-counter magnesium 250 mg daily         Relevant Orders    HGA1C (HEMOGLOBIN A1C WITH EST AVG GLUCOSE)    CBC    COMPREHENSIVE METABOLIC PNL, FASTING    LIPID PANEL    MAGNESIUM    THYROID STIMULATING HORMONE (SENSITIVE TSH)    VITAMIN D 25 TOTAL    VITAMIN B12    Impaired functional mobility, balance, gait, and  endurance     Discussed fall risk.  Patient is ambulatory without a cane.  Has been doing well.  Drives.  No concerns voiced today.         Long term current use of anticoagulant therapy     Continue Eliquis 5 mg BID .         Relevant Orders    HGA1C (HEMOGLOBIN A1C WITH EST AVG GLUCOSE)    CBC    COMPREHENSIVE METABOLIC PNL, FASTING    LIPID PANEL    MAGNESIUM    THYROID STIMULATING HORMONE (SENSITIVE TSH)    VITAMIN D 25 TOTAL    VITAMIN B12     Tobacco cessation counseling not performed due to nonsmoker.     Labs drawn today.  Continue current medications  as prescribed.  Advised to follow up with specialist as scheduled.  Advised a low-fat/low sodium diet, advised 150 minutes of scheduled weekly physical activity as tolerated.  Advised to maintain a healthy weight.     Return in about 6 months (around 10/01/2023) for routine visit; schedule telehealth medicare wellness this month at 4:30 pm.    Stormy Fabian, NP-C 04/03/2023, 12:54

## 2023-04-03 NOTE — Assessment & Plan Note (Signed)
BP stable and controlled, continue current medications.  Discontinue HCTZ 25 mg daily.

## 2023-04-03 NOTE — Assessment & Plan Note (Signed)
Continue Breo and albuterol PRN.   Does not recall last time he used albuterol

## 2023-04-03 NOTE — Assessment & Plan Note (Signed)
Continue Eliquis 5mg BID 

## 2023-04-03 NOTE — Assessment & Plan Note (Signed)
Reports he quit using CPAP in Sept 2023, reports he is sleeping better.  No longer following with Pulmonology.

## 2023-04-03 NOTE — Assessment & Plan Note (Signed)
Continue weekly vitamin D 50, 000 IU

## 2023-04-03 NOTE — Assessment & Plan Note (Signed)
Patient is no longer taking Jardiance due to hx of fall and decreased kidney function.  Patient has not restarted.

## 2023-04-03 NOTE — Assessment & Plan Note (Signed)
Continue over-the-counter magnesium 250 mg daily.

## 2023-04-03 NOTE — Assessment & Plan Note (Signed)
Managing with diet and exercise.

## 2023-04-03 NOTE — Assessment & Plan Note (Signed)
 Continue Tamsulosin 0.4 mg daily.

## 2023-04-03 NOTE — Assessment & Plan Note (Addendum)
Rosuvastatin 20 mg daily has been discontinued due to decreased kidney function at time of hospitalization in August 2024.

## 2023-04-03 NOTE — Assessment & Plan Note (Signed)
ampicillin 500 mg daily > 30 years, discontinued in August 2024 post hospitalization.  Folliculitis is controlled.

## 2023-04-03 NOTE — Assessment & Plan Note (Signed)
Further treatment pending labs.

## 2023-04-04 NOTE — Telephone Encounter (Signed)
Faxed labs to Dr. Renda Rolls

## 2023-04-11 ENCOUNTER — Ambulatory Visit (RURAL_HEALTH_CENTER): Payer: Self-pay | Admitting: Family

## 2023-04-19 ENCOUNTER — Ambulatory Visit (RURAL_HEALTH_CENTER): Payer: Medicare Other | Admitting: Family

## 2023-05-04 ENCOUNTER — Ambulatory Visit (INDEPENDENT_AMBULATORY_CARE_PROVIDER_SITE_OTHER): Payer: Medicare Other | Admitting: Student in an Organized Health Care Education/Training Program

## 2023-05-04 ENCOUNTER — Other Ambulatory Visit: Payer: Self-pay

## 2023-05-04 ENCOUNTER — Ambulatory Visit (RURAL_HEALTH_CENTER): Payer: Medicare Other | Admitting: Family

## 2023-05-04 ENCOUNTER — Encounter (INDEPENDENT_AMBULATORY_CARE_PROVIDER_SITE_OTHER): Payer: Self-pay | Admitting: Student in an Organized Health Care Education/Training Program

## 2023-05-04 VITALS — BP 119/61 | HR 66 | Ht 70.5 in | Wt 175.0 lb

## 2023-05-04 DIAGNOSIS — R3912 Poor urinary stream: Secondary | ICD-10-CM

## 2023-05-04 DIAGNOSIS — N401 Enlarged prostate with lower urinary tract symptoms: Secondary | ICD-10-CM

## 2023-05-04 DIAGNOSIS — N281 Cyst of kidney, acquired: Secondary | ICD-10-CM

## 2023-05-04 DIAGNOSIS — M720 Palmar fascial fibromatosis [Dupuytren]: Secondary | ICD-10-CM | POA: Insufficient documentation

## 2023-05-04 DIAGNOSIS — Z87448 Personal history of other diseases of urinary system: Secondary | ICD-10-CM

## 2023-05-04 DIAGNOSIS — R351 Nocturia: Secondary | ICD-10-CM

## 2023-05-04 DIAGNOSIS — R3915 Urgency of urination: Secondary | ICD-10-CM

## 2023-05-04 DIAGNOSIS — I130 Hypertensive heart and chronic kidney disease with heart failure and stage 1 through stage 4 chronic kidney disease, or unspecified chronic kidney disease: Secondary | ICD-10-CM | POA: Insufficient documentation

## 2023-05-04 DIAGNOSIS — N400 Enlarged prostate without lower urinary tract symptoms: Secondary | ICD-10-CM

## 2023-05-04 DIAGNOSIS — I4811 Longstanding persistent atrial fibrillation: Secondary | ICD-10-CM | POA: Insufficient documentation

## 2023-05-04 DIAGNOSIS — R338 Other retention of urine: Secondary | ICD-10-CM

## 2023-05-04 DIAGNOSIS — F0390 Unspecified dementia without behavioral disturbance: Secondary | ICD-10-CM | POA: Insufficient documentation

## 2023-05-04 DIAGNOSIS — R319 Hematuria, unspecified: Secondary | ICD-10-CM

## 2023-05-04 DIAGNOSIS — R339 Retention of urine, unspecified: Secondary | ICD-10-CM

## 2023-05-04 LAB — POCT URINE DIPSTICK
BILIRUBIN: NEGATIVE
BLOOD: NEGATIVE
GLUCOSE: NEGATIVE
KETONE: NEGATIVE
LEUKOCYTES: NEGATIVE
NITRITE: NEGATIVE
PH: 5.5
PROTEIN: 100
SPECIFIC GRAVITY: 1.02
UROBILINOGEN: 0.2

## 2023-05-04 NOTE — Progress Notes (Unsigned)
UROLOGY, [facility]  [address] [address]    Progress Note    Name: Jesse Cooper. MRN:  [id number]   Date: 05/04/2023 DOB:  1941/02/26 (82 y.o.)             Chief Complaint: Benign Prostatic Hypertrophy (F/U on cardiac clearance for procedure)  Subjective   Subjective:   Calieb Mavros. is a 82 y.o., White male who presents to ER with a fall.  He was then found to have experienced urinary retention and a Foley catheter was placed.  He then failed a voiding trial thereafter and a catheter was replaced.  He was not on any home BPH medications.  He denies a history of urologic surgery.  He denies a history of urinary tract infections.  He denies a personal or family history of urologic malignancy.      He presents to clinic today due to hematuria in his catheter.  The catheter is draining well.  He went to the ER the day prior and had a CT scan which was negative.  His catheter is draining well at this time with light pink urine.  He is on Eliquis.      He subsequently passed his voiding trial. He continues to use finasteride and Flomax. He reports nocturia 2-3 times per night and continues to have a slightly weak stream and urinary urgency during the day. His PVR today is 35cc. He was recently cleared for surgery by his cardiologist. He denies any fevers, chills, nausea, vomiting, flank pain, suprapubic pain, headache, chest pain, shortness of breath, vision changes.    PROSTATE SPECIFIC ANTIGEN   Lab Results   Component Value Date/Time    PROSSPECAG 1.19 04/08/2022 12:04 PM                Objective   Objective :  BP 119/61 (Site: Right Arm, Patient Position: Sitting, Cuff Size: Adult)   Pulse 66   Ht 1.791 m (5' 10.5")   Wt 79.4 kg (175 lb)   BMI 24.75 kg/m       Gen: NAD, alert  Pulm: unlabored at rest  CV: palpable pulses  Abd: soft, Nt/ND  GU: no suprapubic tenderness, no CVAT      Data reviewed:    Current Outpatient Medications   Medication Sig    acetaminophen  (TYLENOL ARTHRITIS PAIN) 650 mg Oral Tablet Sustained Release Take 1 Tablet (650 mg total) by mouth Twice daily    albuterol sulfate (PROVENTIL OR VENTOLIN OR PROAIR) 90 mcg/actuation Inhalation oral inhaler Take 2 Puffs by inhalation Every 6 hours as needed    BREO ELLIPTA 100-25 mcg/dose Inhalation Disk with Device 1 Inhalation Once a day    carvediloL (COREG) 25 mg Oral Tablet Take 1 Tablet (25 mg total) by mouth Twice daily    casanthranol/docusate sodium (PERI-COLACE ORAL) Take by mouth 1 am and 2 pm    cholecalciferol, vitamin D3, 1,250 mcg (50,000 unit) Oral Capsule TAKE ONE CAPSULE BY MOUTH ONCE EVERY SEVEN DAYS    ELIQUIS 5 mg Oral Tablet Take 1 Tablet (5 mg total) by mouth Twice daily    ferrous sulfate (FERATAB) 324 mg (65 mg iron) Oral Tablet, Delayed Release (E.C.) Take 1 Tablet (324 mg total) by mouth Twice a day before meals    finasteride (PROSCAR) 5 mg Oral Tablet Take 1 Tablet (5 mg total) by mouth Once a day    losartan (COZAAR) 100 mg Oral  Tablet TAKE ONE TABLET BY MOUTH ONCE EVERY DAY    magnesium oxide (MAG-OX) 400 mg Oral Tablet Take 1 Tablet (400 mg total) by mouth Once a day    memantine (NAMENDA) 10 mg Oral Tablet TAKE 1/2 TABLET BY MOUTH ONCE EVERY DAY at bedtime FOR 7 DAYS THEN TAKE 1/2 TABLET TWICE DAILY FOR 7 DAYS THEN TAKE 1/2 TABLET EVERY MORNING AND TAKE ONE TABLET AT BEDTIME FOR 7 DAYS THEN TAKE ONE TABLET TWICE DAILY THEREAFTER    multivitamin (SUPER THERA VITE M) Oral Tablet Take 1 Tablet by mouth    POTASSIUM-99 ORAL Take 1 Tablet by mouth    rivastigmine (EXELON) 1.5 mg Oral Capsule Take 1 Capsule (1.5 mg total) by mouth Twice daily with food    tamsulosin (FLOMAX) 0.4 mg Oral Capsule Take 1 Capsule (0.4 mg total) by mouth Every evening after dinner    VITAMIN B-1 100 mg Oral Tablet Take 1 Tablet (100 mg total) by mouth Once a day        Assessment/Plan  Problem List Items Addressed This Visit          Urology    Benign prostatic hyperplasia, unspecified whether lower urinary  tract symptoms present - Primary    Relevant Orders    POCT Urine Dipstick (Completed)    Urinary retention    Relevant Orders    POCT Urine Dipstick (Completed)         Urinary retention, 1.2L  PVR at each visit        BPH/LUTS, on Flomax and finasteride; 135cc  I discussed the differential diagnosis, pathophysiology and nature of patient's benign prostate enlargement causing lower urinary tract symptoms  I counseled patient on further conservative management options including appropriate fluid management, avoidance of diuretics including caffeine and alcohol  Additionally, we discussed the role of pharmacotherapy, including risks, benefits and alternatives:  Alpha-blocker therapy [e.g. Tamsulosin] - potential risks of dizziness, asthenia, orthostasis, and retrograde ejaculation  5-Alpha Reductase Inhibitors [e.g. Finasteride] - potential risks of painful breast enlargement, diminished libido or erectile dysfunction, ejaculatory dysfunction, and potential concerns for increased risk of high grade prostate cancer and delayed time improved symptoms  Given the failure of more conservative options and persistently bothersome symptoms, patient expressed interest in pursuing more invasive surgical options  Patient was counseled on various invasive surgical options in the management of BPH, including in-office transurethral microwave therapy (TUMT), GreenLightT photoselective vaporization of the prostate (PVP), bipolar transurethral resection of the prostate (BiTURP) and open suprapubic prostatectomy  Specific risks inherent to all transurethral surgery including but not limited to bleeding, infection, anesthetic complications, persistent voiding dysfunction, need for additional procedures, de novo urinary incontinence, urethral strictures and need for prolonged catheterization were discussed in detail  After further discussion, patient has elected to undergo PVP at the next available date  *** provided for Tamsulosin  Hydrochloride (FlomaxT) 0.4 mg P.O. daily:    I have discussed in great detail with the patient the treatment of prostate enlargement/lower urinary tract symptoms using tamsulosin.  I have explained my rationale for using tamsulosin as well as potential risks of asthenia (2%), dizziness (5%), rhinitis (3%) and abnormal ejaculation (11%). I encouraged patient to report any prior or current history of alpha-1 antagonist use to their opthalmic surgeon before undergoing any eye surgery due to the risk of intraoperative floppy iris syndrome.  The patient expressed an understanding of the treatment, possible reactions, and possible prognosis.  *** provided for Finasteride (ProscarT) 5 mg P.O. daily:  I have discussed in great detail with the patient the treatment of prostate enlargement using finasteride.  I have explained my rationale for using finasteride as well as potential long-term risks of sexual dysfunction, including impotence (5.1%), decreased libido (2.6%), and decreased ejaculate volume (1.5%). I also explained the possibility of breast enlargement (1.8%) and/or tenderness (0.7%).  I did warn patient about the possibility of hypersensitivity reactions leading to rash, itching, hives, or swelling (0.5%).  He was instructed to call the office if any of these or any other possible reactions occurred.  I further advised patient that finasteride may cause an alteration in his PSA level and the FDA caution of a possible "increase in the risk of high-grade prostate cancer".  The patient expressed an understanding of the treatment, possible reactions, and possible prognosis.            Gross hematuria on Eliquis  No further hematuria now that he is urinating without catheter. No further workup at this time          Left renal cysts  I had a very lengthy discussion with patient regarding the characteristics, nature and work-up of their renal cyst, as well as the rationale, implications and alternatives of the various  management options.  We spent considerable time personally reviewing relevant imaging which are consistent with a simple renal cyst  I discussed the nature of incompletely characterized renal cyst warranting further dedicated imaging for further lesion characterization  I discussed the nature of simple appearing Bosniak I renal cyst which have been reported to affect more than 50% of patients over the age of 21 and warrant no further workup, surveillance or intervention if asymptomatic; however, repeat ultrasonography may be considered at 6 to 12 months in selected patients in order to confirm stability and a correct diagnosis  I discussed the nature of minimally complex Bosniak II renal cyst which pose limited oncologic potential and generally do not warrant further radiographic surveillance; however, repeat ultrasonography may be considered at 6 to 12 months in selected patients in order to confirm stability and a correct diagnosis  I discussed the nature of complex Bosniak IIF renal cyst which has a reported incidence of malignancy ranging from 5-20% and accordingly warrants further radiographic surveillance  I discussed the nature of complex Bosniak III renal cyst which carries nearly a 50% risk of malignancy and warrants surgical extirpation  I discussed the nature of complex Bosniak IV renal cyst which carries nearly a 90% risk of malignancy and warrants surgical extirpation  Based on patient's clinical and radiographic findings, I would recommend no further imaging at this time  Generally the indications for surgical intervention for renal cysts include suspected malignancy, pain attributable to cyst expansion, cyst infection and angiotensin-dependent hypertension.  We also discussed the limited role of percutaneous renal fine-needle aspiration (FNA) biopsy; unfortunately, biopsy cannot reliably distinguish a benign renal cyst from the less common cystic renal cell carcinoma.  Preoperative needle biopsy has  generally not been recommended for resectable renal lesions, because of concern about seeding of the peritoneum.        Nocturia  I discussed potential management strategies to limit patient's nocturia episodes including:  Appropriate fluid management  Appropriate timing of diuretics, if applicable  Limiting nighttime fluid intake  Elevation of patient's legs prior to sleep  Compression stockings  Limiting alcohol and caffeine intake        Arlana Hove, DO     A combined total of 30 minutes were spent  preparing to see the patient, reviewing previous records, ordering tests/medications/procedures, documenting the clinical encounter as well as performing a medically appropriate evaluation and independently interpreting results and communicating them to the patient/family/caregiver as specifically outlined above in the impression and plan.    This note may have been fully or partially generated using MModal Fluency Direct system, and there may be some incorrect words, spellings, and punctuation that were not identified in checking the note before saving.

## 2023-05-07 ENCOUNTER — Other Ambulatory Visit (RURAL_HEALTH_CENTER): Payer: Self-pay | Admitting: Family

## 2023-05-07 DIAGNOSIS — E559 Vitamin D deficiency, unspecified: Secondary | ICD-10-CM

## 2023-05-07 DIAGNOSIS — D529 Folate deficiency anemia, unspecified: Secondary | ICD-10-CM | POA: Insufficient documentation

## 2023-05-07 MED ORDER — FOLIC ACID 1 MG TABLET
1.0000 mg | ORAL_TABLET | Freq: Every day | ORAL | 1 refills | Status: AC
Start: 2023-05-07 — End: 2023-08-05

## 2023-05-30 ENCOUNTER — Telehealth (RURAL_HEALTH_CENTER): Payer: Self-pay | Admitting: Family

## 2023-05-30 MED ORDER — LOSARTAN 100 MG TABLET
100.0000 mg | ORAL_TABLET | Freq: Every day | ORAL | 1 refills | Status: DC
Start: 2023-05-30 — End: 2023-06-12

## 2023-06-01 ENCOUNTER — Encounter (INDEPENDENT_AMBULATORY_CARE_PROVIDER_SITE_OTHER): Payer: Self-pay | Admitting: Student in an Organized Health Care Education/Training Program

## 2023-06-06 ENCOUNTER — Other Ambulatory Visit (RURAL_HEALTH_CENTER): Payer: Self-pay | Admitting: Family

## 2023-06-12 ENCOUNTER — Other Ambulatory Visit (RURAL_HEALTH_CENTER): Payer: Self-pay | Admitting: Family

## 2023-06-15 ENCOUNTER — Other Ambulatory Visit: Payer: Self-pay

## 2023-06-22 ENCOUNTER — Ambulatory Visit (RURAL_HEALTH_CENTER): Payer: Self-pay | Admitting: Family

## 2023-06-27 ENCOUNTER — Ambulatory Visit (RURAL_HEALTH_CENTER): Payer: Medicare Other | Attending: Family | Admitting: Family

## 2023-06-27 ENCOUNTER — Other Ambulatory Visit: Payer: Self-pay

## 2023-06-27 ENCOUNTER — Encounter (RURAL_HEALTH_CENTER): Payer: Self-pay | Admitting: Family

## 2023-06-27 VITALS — BP 172/66 | HR 60 | Temp 97.5°F | Resp 18 | Ht 70.5 in | Wt 162.0 lb

## 2023-06-27 DIAGNOSIS — M545 Low back pain, unspecified: Secondary | ICD-10-CM | POA: Insufficient documentation

## 2023-06-27 MED ORDER — TRIAMCINOLONE ACETONIDE 40 MG/ML SUSPENSION FOR INJECTION
40.0000 mg | INTRAMUSCULAR | Status: AC
Start: 2023-06-27 — End: 2023-06-27
  Administered 2023-06-27: 40 mg via INTRAMUSCULAR

## 2023-06-27 NOTE — Progress Notes (Signed)
FAMILY MEDICINE, [facility]  [address]  [place] [address]  Operated by [facility]     Name: Jesse Cooper. MRN:  [id number]   Date of Birth: 05/19/1941 Age: 83 y.o.   Date: 06/27/2023  Time: 14:20     Provider: Asa Lente, FNP    Reason for visit: Back Pain      History of Present Illness:  Jesse Cooper. is a 83 y.o. male presents to the clinic with c/o back pain. His son is with him at this appt.  He tells me his low back pain is midline and radiates "up a little"  He denies any sharpe pain going down his legs or hips.  His discomfort has been going on for a month or so... He had other medical issues recently that he had to address. Per son he not able sleep in bed, because its hard to get up from a lying position.  Jesse Cooper tells me he not strong enough to pull him self up out of the bed.   He describes the discomfort as "always there to some degree"  Worse when walking, better with sitting and he just does not stand a lot.  He fell in August that resulted in hospital at that time he had issues with urinary retention. Marland Kitchen     He wants to see that provider that makes his back feel better.  In the past he responded well to therapy.   He had several back surgeries years ago.                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 Fredonia Highland      Urinary retention 01/16/2023    Chronic diastolic (congestive) heart failure (CMS HCC) 10/06/2022     Managed by Dr. Renda Rolls, Cardiologist.      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 Felton Clinton, Pulmonologist.      Pleural effusion 08/04/2021     Dr. Felton Clinton, Pulmonologist.      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 Tessie Eke, Neurologist, [place], [place]      DDD (degenerative disc disease), lumbar 11/25/2009       Historical Data    Past Medical History:  Past Medical History:   Diagnosis Date    CKD (chronic kidney disease) stage 3, 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 HCC) 04/10/2022    Gross hematuria 01/16/2023     Hospital discharge follow-up 01/23/2023    Pleural effusion associated with hepatic disorder     Dr. Felton Clinton, Pulmonologist.    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    US THORACENTESIS - Approximately 1300 mL of strawcolored fluid was aspirated (Imaging)    Prostate cancer screening     Rhabdomyolysis 01/13/2023     Past Surgical History:  Past Surgical History:   Procedure Laterality Date    COLONSCOPY BEDSIDE N/A 2012    [place] - no polyps    HAND SURGERY Left     Charise Killian    [facility] SURGERY N/A 2011    2013 [place]     Allergies:  Allergies   Allergen Reactions    Hydrocodone-Homatropine Itching     Medications:  Current Outpatient Medications   Medication Sig    acetaminophen (TYLENOL ARTHRITIS PAIN) 650 mg Oral Tablet Sustained Release Take 1 Tablet (650 mg total) by mouth Twice daily    albuterol sulfate (PROVENTIL OR VENTOLIN OR PROAIR) 90 mcg/actuation Inhalation oral inhaler Take 2 Puffs by inhalation Every 6 hours as needed    BREO ELLIPTA 100-25 mcg/dose Inhalation Disk with Device 1 Inhalation Once a day    carvediloL (COREG) 25 mg Oral Tablet Take 1 Tablet (25 mg total) by mouth Twice daily    casanthranol/docusate sodium (PERI-COLACE ORAL) Take by mouth 1 am and 2 pm    ELIQUIS 5 mg Oral Tablet Take 1 Tablet (5 mg total) by mouth Twice daily    ferrous sulfate (FERATAB) 324 mg (65 mg iron) Oral Tablet, Delayed Release (E.C.) Take 1 Tablet (324 mg total) by mouth Twice a day before meals    finasteride (PROSCAR) 5 mg Oral Tablet Take 1 Tablet (5 mg total) by mouth Once a day    folic acid (FOLVITE) 1 mg Oral Tablet Take 1 Tablet (1 mg total) by mouth Once a day for 90 days    losartan (COZAAR) 100 mg Oral Tablet TAKE ONE TABLET BY MOUTH ONCE EVERY DAY    magnesium oxide (MAG-OX) 400 mg Oral Tablet Take 1 Tablet (400 mg total) by mouth Once a day    memantine (NAMENDA) 10 mg Oral Tablet TAKE 1/2 TABLET BY MOUTH ONCE EVERY DAY at bedtime FOR 7 DAYS THEN TAKE  1/2 TABLET TWICE DAILY FOR 7 DAYS THEN TAKE 1/2 TABLET EVERY MORNING AND TAKE  ONE TABLET AT BEDTIME FOR 7 DAYS THEN TAKE ONE TABLET TWICE DAILY THEREAFTER    multivitamin (SUPER THERA VITE M) Oral Tablet Take 1 Tablet by mouth    POTASSIUM-99 ORAL Take 1 Tablet by mouth    rivastigmine (EXELON) 1.5 mg Oral Capsule Take 1 Capsule (1.5 mg total) by mouth Twice daily with food    tamsulosin (FLOMAX) 0.4 mg Oral Capsule Take 1 Capsule (0.4 mg total) by mouth Every evening after dinner    VITAMIN B-1 100 mg Oral Tablet Take 1 Tablet (100 mg total) by mouth Once a day     Family History:  Family Medical History:       Problem Relation (Age of Onset)    Dementia Mother    Elevated Lipids Brother    Lung Cancer Father            Social History:  Social History     Socioeconomic History    Marital status: Widowed    Number of children: 3   Occupational History    Occupation: retired   Tobacco Use    Smoking status: Former     Current packs/day: 0.00     Average packs/day: 1 pack/day for 13.0 years (13.0 ttl pk-yrs)     Types: Cigarettes     Start date: 15     Quit date: 1975     Years since quitting: 50.1    Smokeless tobacco: Never    Tobacco comments:     Quit chewing tobacco in 1988   Vaping Use    Vaping status: Never Used   Substance and Sexual Activity    Alcohol use: Not Currently     Alcohol/week: 8.0 standard drinks of alcohol     Types: 1 Cans of beer, 7 Standard drinks or equivalent per week     Comment: 1 beer with dinner    Drug use: Never    Sexual activity: Not Currently     Partners: Female     Social Determinants of Health     Social Connections: Medium Risk (01/13/2023)    Social Connections     SDOH Social Isolation: 3 to 5 times a week           Review of Systems:  Any pertinent Review of Systems as addressed in the HPI above.    Physical Exam:  Vital Signs:  Vitals:    06/27/23 1326   BP: (!) 172/66   Pulse: 60   Resp: 18   Temp: 36.4 C (97.5 F)   TempSrc: Temporal   SpO2: 98%   Weight: 73.5 kg (162  lb)   Height: 1.791 m (5' 10.5")   BMI: 22.92     Physical Exam  Vitals reviewed.   Constitutional:       Appearance: Normal appearance.   HENT:      Head: Normocephalic and atraumatic.   Eyes:      General: No scleral icterus.     Conjunctiva/sclera: Conjunctivae normal.   Cardiovascular:      Rate and Rhythm: Normal rate.   Pulmonary:      Effort: Pulmonary effort is normal.   Musculoskeletal:      Lumbar back: Tenderness present. No swelling, deformity or spasms.      Comments: He a tall thin elderly gentle man with a moderate degree of Kyphosis.    Skin:     Coloration: Skin is not cyanotic or pale.   Neurological:      Mental  Status: He is alert and oriented to person, place, and time. Mental status is at baseline.          Assessment/Plan:  (M54.50) Acute midline low back pain without sciatica  (primary encounter diagnosis)  Plan: XR LUMBAR SPINE AP/OBLIQUES/LAT/SPOT, Referral         to PHYSICAL/OCCUPATIONAL THERAPY - External     Reviewed lumbar xray from August 16th, 2024 with patient and son.   Study Result    Narrative & Impression   Jesse T Faron JR.     RADIOLOGIST: Carma Lair     XR LUMBAR SPINE AP AND LAT performed on 01/13/2023 7:37 PM     CLINICAL HISTORY: Fall.  fall, back pain     TECHNIQUE:  2 views of the lumbar spine.     COMPARISON: None.     FINDINGS:  Vertebral body heights appear maintained. No evidence of fracture.  Moderate multilevel disc space narrowing. Large osteophytes are seen anteriorly throughout the spine. There is facet arthropathy throughout the lumbar spine which is most pronounced from L3 to S1.  There is some dextrocurvature of the lumbar spine. No significant spondylolisthesis.        IMPRESSION:  ADVANCED DEGENERATIVE CHANGES OF THE LUMBAR SPINE.              Radiologist location ID: [id number]     No history of CKD or diabetes.    I will prescribe a shot of kenalog today to see  if this helps.   His daughter is giving him arthritis strength tylenol during the day.    I am going to repeat the lumbar xray- they are going to [facility]  And send him to PT to see if that helps.     Plan of care was discussed with patient and son and agreed upon.     Orders Placed This Encounter    XR LUMBAR SPINE AP/OBLIQUES/LAT/SPOT    Referral to PHYSICAL/OCCUPATIONAL THERAPY - External    triamcinolone acetonide (KENALOG-40) 40 mg/mL injection           Return in about 2 weeks (around 07/11/2023) for With [facility] for low back pain. Bryson Ha Reeve Turnley, FNP-C     Portions of this note may be dictated using voice recognition software or a dictation service. Variances in spelling and vocabulary are possible and unintentional. Not all errors are caught/corrected. Please notify the Thereasa Parkin if any discrepancies are noted or if the meaning of any statement is not clear.

## 2023-06-27 NOTE — Nursing Note (Signed)
Patient here today with complaints of lower back pain. Patient states that it has been going on for about a month now.

## 2023-06-28 ENCOUNTER — Ambulatory Visit (RURAL_HEALTH_CENTER): Payer: Self-pay | Admitting: Family

## 2023-07-03 ENCOUNTER — Telehealth (INDEPENDENT_AMBULATORY_CARE_PROVIDER_SITE_OTHER): Payer: Self-pay | Admitting: Student in an Organized Health Care Education/Training Program

## 2023-07-03 NOTE — Telephone Encounter (Signed)
Patient called to cancel upcoming Jesse Cooper laser surgery scheduled 09/04/23.  He states he is going out of town for a while but is doing ok,  if he feels he needs surgery he will call back.

## 2023-07-10 ENCOUNTER — Ambulatory Visit (RURAL_HEALTH_CENTER): Payer: Medicare Other | Admitting: Family

## 2023-07-10 ENCOUNTER — Other Ambulatory Visit: Payer: Self-pay

## 2023-07-11 ENCOUNTER — Ambulatory Visit (RURAL_HEALTH_CENTER): Payer: Self-pay | Admitting: Family

## 2023-07-12 ENCOUNTER — Ambulatory Visit (RURAL_HEALTH_CENTER): Payer: Self-pay | Admitting: Family

## 2023-07-17 ENCOUNTER — Other Ambulatory Visit (RURAL_HEALTH_CENTER): Payer: Self-pay | Admitting: Family

## 2023-09-28 ENCOUNTER — Encounter (RURAL_HEALTH_CENTER): Payer: Self-pay

## 2023-10-02 ENCOUNTER — Ambulatory Visit (RURAL_HEALTH_CENTER): Payer: Self-pay | Admitting: Family

## 2023-10-11 ENCOUNTER — Ambulatory Visit (RURAL_HEALTH_CENTER): Payer: Self-pay | Admitting: Family

## 2023-10-25 ENCOUNTER — Ambulatory Visit: Attending: Family | Admitting: Family

## 2023-10-25 ENCOUNTER — Other Ambulatory Visit: Payer: Self-pay

## 2023-10-25 ENCOUNTER — Ambulatory Visit (RURAL_HEALTH_CENTER): Payer: Self-pay | Attending: Family | Admitting: Family

## 2023-10-25 ENCOUNTER — Encounter (RURAL_HEALTH_CENTER): Payer: Self-pay | Admitting: Family

## 2023-10-25 VITALS — BP 151/65 | HR 62 | Temp 97.8°F | Wt 179.0 lb

## 2023-10-25 DIAGNOSIS — R531 Weakness: Secondary | ICD-10-CM | POA: Insufficient documentation

## 2023-10-25 DIAGNOSIS — G4730 Sleep apnea, unspecified: Secondary | ICD-10-CM | POA: Insufficient documentation

## 2023-10-25 DIAGNOSIS — R7301 Impaired fasting glucose: Secondary | ICD-10-CM | POA: Insufficient documentation

## 2023-10-25 DIAGNOSIS — Z7901 Long term (current) use of anticoagulants: Secondary | ICD-10-CM | POA: Insufficient documentation

## 2023-10-25 DIAGNOSIS — D529 Folate deficiency anemia, unspecified: Secondary | ICD-10-CM | POA: Insufficient documentation

## 2023-10-25 DIAGNOSIS — N400 Enlarged prostate without lower urinary tract symptoms: Secondary | ICD-10-CM | POA: Insufficient documentation

## 2023-10-25 DIAGNOSIS — R269 Unspecified abnormalities of gait and mobility: Secondary | ICD-10-CM | POA: Insufficient documentation

## 2023-10-25 DIAGNOSIS — I10 Essential (primary) hypertension: Secondary | ICD-10-CM

## 2023-10-25 DIAGNOSIS — F0390 Unspecified dementia without behavioral disturbance: Secondary | ICD-10-CM | POA: Insufficient documentation

## 2023-10-25 DIAGNOSIS — Z7409 Other reduced mobility: Secondary | ICD-10-CM | POA: Insufficient documentation

## 2023-10-25 DIAGNOSIS — D696 Thrombocytopenia, unspecified: Secondary | ICD-10-CM

## 2023-10-25 DIAGNOSIS — D509 Iron deficiency anemia, unspecified: Secondary | ICD-10-CM | POA: Insufficient documentation

## 2023-10-25 DIAGNOSIS — Z7951 Long term (current) use of inhaled steroids: Secondary | ICD-10-CM | POA: Insufficient documentation

## 2023-10-25 DIAGNOSIS — J449 Chronic obstructive pulmonary disease, unspecified: Secondary | ICD-10-CM | POA: Insufficient documentation

## 2023-10-25 DIAGNOSIS — I5032 Chronic diastolic (congestive) heart failure: Secondary | ICD-10-CM | POA: Insufficient documentation

## 2023-10-25 DIAGNOSIS — E559 Vitamin D deficiency, unspecified: Secondary | ICD-10-CM | POA: Insufficient documentation

## 2023-10-25 DIAGNOSIS — I4891 Unspecified atrial fibrillation: Secondary | ICD-10-CM | POA: Insufficient documentation

## 2023-10-25 DIAGNOSIS — R270 Ataxia, unspecified: Secondary | ICD-10-CM | POA: Insufficient documentation

## 2023-10-25 DIAGNOSIS — N183 Chronic kidney disease, stage 3 unspecified: Secondary | ICD-10-CM | POA: Insufficient documentation

## 2023-10-25 DIAGNOSIS — E782 Mixed hyperlipidemia: Secondary | ICD-10-CM | POA: Insufficient documentation

## 2023-10-25 DIAGNOSIS — I130 Hypertensive heart and chronic kidney disease with heart failure and stage 1 through stage 4 chronic kidney disease, or unspecified chronic kidney disease: Secondary | ICD-10-CM | POA: Insufficient documentation

## 2023-10-25 LAB — MAGNESIUM: MAGNESIUM: 2.4 mg/dL (ref 1.9–2.7)

## 2023-10-25 LAB — CBC
HCT: 41 % (ref 36.7–47.1)
HGB: 14 g/dL (ref 12.5–16.3)
MCH: 33.1 pg (ref 23.8–33.4)
MCHC: 34.2 g/dL (ref 32.5–36.3)
MCV: 96.6 fL — ABNORMAL HIGH (ref 73.0–96.2)
MPV: 7.8 fL (ref 7.4–11.4)
PLATELETS: 95 10*3/uL — ABNORMAL LOW (ref 140–440)
RBC: 4.24 10*6/uL (ref 4.06–5.63)
RDW: 13.5 % (ref 12.1–16.2)
WBC: 3.8 10*3/uL (ref 3.6–10.2)

## 2023-10-25 LAB — COMPREHENSIVE METABOLIC PNL, FASTING
ALBUMIN/GLOBULIN RATIO: 1.4 (ref 0.8–1.4)
ALBUMIN: 4.7 g/dL (ref 3.5–5.7)
ALKALINE PHOSPHATASE: 83 U/L (ref 34–104)
ALT (SGPT): 18 U/L (ref 7–52)
ANION GAP: 4 mmol/L (ref 4–13)
AST (SGOT): 29 U/L (ref 13–39)
BILIRUBIN TOTAL: 1.1 mg/dL — ABNORMAL HIGH (ref 0.3–1.0)
BUN/CREA RATIO: 37 — ABNORMAL HIGH (ref 6–22)
BUN: 28 mg/dL — ABNORMAL HIGH (ref 7–25)
CALCIUM, CORRECTED: 9.4 mg/dL (ref 8.9–10.8)
CALCIUM: 10 mg/dL (ref 8.6–10.3)
CHLORIDE: 110 mmol/L — ABNORMAL HIGH (ref 98–107)
CO2 TOTAL: 28 mmol/L (ref 21–31)
CREATININE: 0.76 mg/dL (ref 0.60–1.30)
ESTIMATED GFR: 90 mL/min/{1.73_m2} (ref >59)
GLOBULIN: 3.3 (ref 2.0–3.5)
GLUCOSE: 104 mg/dL (ref 74–109)
OSMOLALITY, CALCULATED: 289 mosm/kg (ref 270–290)
POTASSIUM: 4.5 mmol/L (ref 3.5–5.1)
PROTEIN TOTAL: 8 g/dL (ref 6.4–8.9)
SODIUM: 142 mmol/L (ref 136–145)

## 2023-10-25 LAB — LIPID PANEL
CHOL/HDL RATIO: 2.6
CHOLESTEROL: 142 mg/dL (ref <200)
HDL CHOL: 54 mg/dL (ref >=40)
LDL CALC: 78 mg/dL (ref 0–100)
TRIGLYCERIDES: 48 mg/dL (ref <=150)
VLDL CALC: 10 mg/dL (ref 0–50)

## 2023-10-25 LAB — IRON TRANSFERRIN AND TIBC
IRON (TRANSFERRIN) SATURATION: 20 % (ref 20–50)
IRON: 60 ug/dL (ref 50–212)
TOTAL IRON BINDING CAPACITY: 304 ug/dL (ref 250–450)
TRANSFERRIN: 217 mg/dL (ref 203–362)
UIBC: 244 ug/dL (ref 130–375)

## 2023-10-25 LAB — THYROID STIMULATING HORMONE (SENSITIVE TSH): TSH: 2.753 u[IU]/mL (ref 0.450–5.330)

## 2023-10-25 LAB — VITAMIN B12: VITAMIN B 12: 676 pg/mL (ref 180–914)

## 2023-10-25 LAB — VITAMIN D 25 TOTAL: VITAMIN D 25, TOTAL: 61.65 ng/mL (ref 30.00–100.00)

## 2023-10-25 LAB — FOLATE: FOLATE: >44.6 ng/mL — ABNORMAL HIGH (ref 5.9–24.8)

## 2023-10-25 LAB — SCAN DIFFERENTIAL: PLATELET MORPHOLOGY COMMENT: DECREASED

## 2023-10-25 LAB — FERRITIN: FERRITIN: 130 ng/mL (ref 24–336)

## 2023-10-25 NOTE — Assessment & Plan Note (Signed)
 Patient is no longer taking Jardiance due to hx of fall and decreased kidney function.  Patient has not restarted.

## 2023-10-25 NOTE — Assessment & Plan Note (Signed)
 Rosuvastatin 20 mg daily has been discontinued due to decreased kidney function at time of hospitalization in August 2024.

## 2023-10-25 NOTE — Assessment & Plan Note (Addendum)
 BP elevated today, patient is asymptomatic. Managed by Cardiology.

## 2023-10-25 NOTE — Assessment & Plan Note (Signed)
Managed by Dr. Javed, Cardiologist.

## 2023-10-25 NOTE — Progress Notes (Signed)
 [place] [facility]  [facility]      Jesse Cooper.  MRN: [id number]  DOB: 1940/11/10  Date of Service: 10/27/2023    CHIEF COMPLAINT  Chief Complaint   Patient presents with    Follow Up 6 Months       SUBJECTIVE  Jesse Strout. is a 83 y.o. male who presents to clinic for chronic disease management.  Patient is accompanied by daughter.  Patient is currently under the care of cardiologist, Dr. Pandora Bogaert;  Dr. Gerome Koyanagi, Neurologist; and Dr. Claire Crick, Urologist. No concerns voiced today.  Patient is accompanied by daughter.      Review of Systems:  Positive ROS discussed in HPI, otherwise all other systems negative.      Medications:   acetaminophen  (TYLENOL  ARTHRITIS PAIN) 650 mg Oral Tablet Sustained Release, Take 1 Tablet (650 mg total) by mouth Twice daily  albuterol  sulfate (PROVENTIL  OR VENTOLIN  OR PROAIR ) 90 mcg/actuation Inhalation oral inhaler, Take 2 Puffs by inhalation Every 6 hours as needed  BREO ELLIPTA  100-25 mcg/dose Inhalation Disk with Device, 1 Inhalation Daily  carvediloL  (COREG ) 25 mg Oral Tablet, Take 1 Tablet (25 mg total) by mouth Twice daily  casanthranol/docusate sodium (PERI-COLACE ORAL), Take by mouth 1 am and 2 pm  ELIQUIS  5 mg Oral Tablet, Take 1 Tablet (5 mg total) by mouth Twice daily  ferrous sulfate (FERATAB) 324 mg (65 mg iron) Oral Tablet, Delayed Release (E.C.), Take 1 Tablet (324 mg total) by mouth Twice a day before meals  finasteride  (PROSCAR ) 5 mg Oral Tablet, Take 1 Tablet (5 mg total) by mouth Once a day  losartan  (COZAAR ) 100 mg Oral Tablet, TAKE ONE TABLET BY MOUTH ONCE EVERY DAY  magnesium  oxide (MAG-OX) 400 mg Oral Tablet, Take 1 Tablet (400 mg total) by mouth Daily  memantine  (NAMENDA ) 10 mg Oral Tablet, TAKE 1/2 TABLET BY MOUTH ONCE EVERY DAY at bedtime FOR 7 DAYS THEN TAKE 1/2 TABLET TWICE DAILY FOR 7 DAYS THEN TAKE 1/2 TABLET EVERY MORNING AND TAKE ONE TABLET AT BEDTIME FOR 7 DAYS THEN TAKE ONE TABLET TWICE DAILY  THEREAFTER  multivitamin (SUPER THERA VITE M) Oral Tablet, Take 1 Tablet by mouth  POTASSIUM-99 ORAL, Take 1 Tablet by mouth  rivastigmine (EXELON) 1.5 mg Oral Capsule, Take 2 Capsules (3 mg total) by mouth Twice daily with food  tamsulosin  (FLOMAX ) 0.4 mg Oral Capsule, Take 1 Capsule (0.4 mg total) by mouth Every evening after dinner  VITAMIN B-1 100 mg Oral Tablet, Take 1 Tablet (100 mg total) by mouth Daily    No facility-administered medications prior to visit.      Allergies:   Allergies   Allergen Reactions    Hydrocodone-Homatropine Itching         OBJECTIVE  BP (!) 151/65 (Site: Right Arm, Patient Position: Sitting, Cuff Size: Adult)   Pulse 62   Temp 36.6 C (97.8 F)   Wt 81.2 kg (179 lb)   SpO2 97%   BMI 25.32 kg/m       Physical Exam  Vitals reviewed.   Constitutional:       Appearance: Normal appearance.   Cardiovascular:      Rate and Rhythm: Normal rate. Rhythm irregular.      Pulses: Normal pulses.           Radial pulses are 2+ on the right side and 2+ on the left side.      Heart sounds: Normal heart sounds, S1 normal and  S2 normal.   Pulmonary:      Effort: Pulmonary effort is normal.      Breath sounds: Normal breath sounds.   Abdominal:      General: Bowel sounds are normal.   Musculoskeletal:      Cervical back: Neck supple.      Right lower leg: No edema.      Left lower leg: No edema.   Skin:     General: Skin is warm.   Neurological:      General: No focal deficit present.      Mental Status: He is alert and oriented to person, place, and time. Mental status is at baseline.      Cranial Nerves: Cranial nerves 2-12 are intact.      Motor: Weakness present.      Coordination: Coordination abnormal.      Gait: Gait abnormal.   Psychiatric:         Speech: Speech normal.         Behavior: Behavior normal. Behavior is cooperative.         Cognition and Memory: Cognition is impaired. Memory is impaired.         ASSESSMENT/PLAN  (E78.2) Mixed hyperlipidemia  (primary encounter  diagnosis)  Plan: HGA1C (HEMOGLOBIN A1C WITH EST AVG GLUCOSE),         CBC, COMPREHENSIVE METABOLIC PNL, FASTING,         LIPID PANEL, MAGNESIUM , THYROID  STIMULATING         HORMONE (SENSITIVE TSH), VITAMIN D  25 TOTAL,         VITAMIN B12, FOLATE, SCAN DIFFERENTIAL    (I10) Hypertension  Plan: HGA1C (HEMOGLOBIN A1C WITH EST AVG GLUCOSE),         CBC, COMPREHENSIVE METABOLIC PNL, FASTING,         LIPID PANEL, MAGNESIUM , THYROID  STIMULATING         HORMONE (SENSITIVE TSH), VITAMIN D  25 TOTAL,         VITAMIN B12, FOLATE, SCAN DIFFERENTIAL    (J44.9) COPD (chronic obstructive pulmonary disease)  Plan: HGA1C (HEMOGLOBIN A1C WITH EST AVG GLUCOSE),         CBC, COMPREHENSIVE METABOLIC PNL, FASTING,         LIPID PANEL, MAGNESIUM , THYROID  STIMULATING         HORMONE (SENSITIVE TSH), VITAMIN D  25 TOTAL,         VITAMIN B12, FOLATE, SCAN DIFFERENTIAL    (I48.91) Atrial fibrillation  Plan: HGA1C (HEMOGLOBIN A1C WITH EST AVG GLUCOSE),         CBC, COMPREHENSIVE METABOLIC PNL, FASTING,         LIPID PANEL, MAGNESIUM , THYROID  STIMULATING         HORMONE (SENSITIVE TSH), VITAMIN D  25 TOTAL,         VITAMIN B12, FOLATE, SCAN DIFFERENTIAL    (I50.32) Chronic diastolic (congestive) heart failure  Plan: HGA1C (HEMOGLOBIN A1C WITH EST AVG GLUCOSE),         CBC, COMPREHENSIVE METABOLIC PNL, FASTING,         LIPID PANEL, MAGNESIUM , THYROID  STIMULATING         HORMONE (SENSITIVE TSH), VITAMIN D  25 TOTAL,         VITAMIN B12, FOLATE, SCAN DIFFERENTIAL    (G47.30) Sleep apnea  Plan: HGA1C (HEMOGLOBIN A1C WITH EST AVG GLUCOSE),         CBC, COMPREHENSIVE METABOLIC PNL, FASTING,         LIPID PANEL, MAGNESIUM , THYROID  STIMULATING  HORMONE (SENSITIVE TSH), VITAMIN D  25 TOTAL,         VITAMIN B12, FOLATE, SCAN DIFFERENTIAL    (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, stage 3 unspecified  Plan: HGA1C (HEMOGLOBIN A1C WITH EST AVG GLUCOSE),         CBC, COMPREHENSIVE METABOLIC PNL, FASTING,         LIPID PANEL, MAGNESIUM , THYROID  STIMULATING          HORMONE (SENSITIVE TSH), VITAMIN D  25 TOTAL,         VITAMIN B12, FOLATE, SCAN DIFFERENTIAL    (N40.0) Benign prostatic hyperplasia, unspecified whether lower urinary tract symptoms present  Plan: HGA1C (HEMOGLOBIN A1C WITH EST AVG GLUCOSE),         CBC, COMPREHENSIVE METABOLIC PNL, FASTING,         LIPID PANEL, MAGNESIUM , THYROID  STIMULATING         HORMONE (SENSITIVE TSH), VITAMIN D  25 TOTAL,         VITAMIN B12, FOLATE, SCAN DIFFERENTIAL    (Z79.01) Long term current use of anticoagulant therapy  Plan: HGA1C (HEMOGLOBIN A1C WITH EST AVG GLUCOSE),         CBC, COMPREHENSIVE METABOLIC PNL, FASTING,         LIPID PANEL, MAGNESIUM , THYROID  STIMULATING         HORMONE (SENSITIVE TSH), VITAMIN D  25 TOTAL,         VITAMIN B12, FOLATE, SCAN DIFFERENTIAL    (R27.0) Ataxia  Plan: HGA1C (HEMOGLOBIN A1C WITH EST AVG GLUCOSE),         CBC, COMPREHENSIVE METABOLIC PNL, FASTING,         LIPID PANEL, MAGNESIUM , THYROID  STIMULATING         HORMONE (SENSITIVE TSH), VITAMIN D  25 TOTAL,         VITAMIN B12, FOLATE, SCAN DIFFERENTIAL    (Z74.09) Impaired functional mobility, balance, gait, and endurance  Plan: HGA1C (HEMOGLOBIN A1C WITH EST AVG GLUCOSE),         CBC, COMPREHENSIVE METABOLIC PNL, FASTING,         LIPID PANEL, MAGNESIUM , THYROID  STIMULATING         HORMONE (SENSITIVE TSH), VITAMIN D  25 TOTAL,         VITAMIN B12, FOLATE, SCAN DIFFERENTIAL    (R73.01) IFG (impaired fasting glucose)  Plan: HGA1C (HEMOGLOBIN A1C WITH EST AVG GLUCOSE),         CBC, COMPREHENSIVE METABOLIC PNL, FASTING,         LIPID PANEL, MAGNESIUM , THYROID  STIMULATING         HORMONE (SENSITIVE TSH), VITAMIN D  25 TOTAL,         VITAMIN B12, FOLATE, SCAN DIFFERENTIAL    (E83.42) Hypomagnesemia  Plan: HGA1C (HEMOGLOBIN A1C WITH EST AVG GLUCOSE),         CBC, COMPREHENSIVE METABOLIC PNL, FASTING,         LIPID PANEL, MAGNESIUM , THYROID  STIMULATING         HORMONE (SENSITIVE TSH), VITAMIN D  25 TOTAL,         VITAMIN B12, FOLATE, SCAN DIFFERENTIAL    (E55.9)  Vitamin D  deficiency, unspecified  Plan: HGA1C (HEMOGLOBIN A1C WITH EST AVG GLUCOSE),         CBC, COMPREHENSIVE METABOLIC PNL, FASTING,         LIPID PANEL, MAGNESIUM , THYROID  STIMULATING         HORMONE (SENSITIVE TSH), VITAMIN D  25 TOTAL,         VITAMIN B12, FOLATE, SCAN DIFFERENTIAL    (  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 Pandora Bogaert, Cardiologist.         Relevant Orders    HGA1C (HEMOGLOBIN A1C WITH EST AVG GLUCOSE)    CBC    COMPREHENSIVE METABOLIC PNL, FASTING    LIPID PANEL    MAGNESIUM     THYROID  STIMULATING HORMONE (SENSITIVE TSH)    VITAMIN D  25 TOTAL    VITAMIN B12    FOLATE (Completed)    SCAN DIFFERENTIAL (Completed)    Hypertension    BP elevated today, patient is asymptomatic. Managed by Cardiology.          Relevant Orders    HGA1C (HEMOGLOBIN A1C WITH EST AVG GLUCOSE)    CBC    COMPREHENSIVE METABOLIC PNL, FASTING    LIPID PANEL    MAGNESIUM     THYROID  STIMULATING HORMONE (SENSITIVE TSH)    VITAMIN D  25 TOTAL    VITAMIN B12    FOLATE (Completed)    SCAN DIFFERENTIAL (Completed)    Mixed hyperlipidemia - Primary    Rosuvastatin  20 mg daily has been discontinued due to decreased kidney function at time of hospitalization in August 2024.          Relevant Orders    HGA1C (HEMOGLOBIN A1C WITH EST AVG GLUCOSE)    CBC    COMPREHENSIVE METABOLIC PNL, FASTING    LIPID PANEL    MAGNESIUM     THYROID  STIMULATING HORMONE (SENSITIVE TSH)     VITAMIN D  25 TOTAL    VITAMIN B12    FOLATE (Completed)    SCAN DIFFERENTIAL (Completed)    Chronic diastolic (congestive) heart failure    Patient is no longer taking Jardiance due to hx of fall and decreased kidney function.  Patient has not restarted.          Relevant Orders    HGA1C (HEMOGLOBIN A1C WITH EST AVG GLUCOSE)    CBC    COMPREHENSIVE METABOLIC PNL, FASTING    LIPID PANEL    MAGNESIUM     THYROID  STIMULATING HORMONE (SENSITIVE TSH)    VITAMIN D  25 TOTAL    VITAMIN B12    FOLATE (Completed)    SCAN DIFFERENTIAL (Completed)       Respiratory    Sleep apnea    Reports he quit using CPAP in Sept 2023, reports he is sleeping better.  No longer following with Pulmonology.          Relevant Orders    HGA1C (HEMOGLOBIN A1C WITH EST AVG GLUCOSE)    CBC    COMPREHENSIVE METABOLIC PNL, FASTING    LIPID PANEL    MAGNESIUM     THYROID  STIMULATING HORMONE (SENSITIVE TSH)    VITAMIN D  25 TOTAL    VITAMIN B12    FOLATE (Completed)  SCAN DIFFERENTIAL (Completed)    COPD (chronic obstructive pulmonary disease)    Continue Breo and albuterol  PRN.   Does not recall last time he used albuterol          Relevant Orders    HGA1C (HEMOGLOBIN A1C WITH EST AVG GLUCOSE)    CBC    COMPREHENSIVE METABOLIC PNL, FASTING    LIPID PANEL    MAGNESIUM     THYROID  STIMULATING HORMONE (SENSITIVE TSH)    VITAMIN D  25 TOTAL    VITAMIN B12    FOLATE (Completed)    SCAN DIFFERENTIAL (Completed)       Neurologic    Unspecified dementia, unspecified severity, without behavioral disturbance, psychotic disturbance, mood disturbance, and anxiety    Managed by Neurology in [place], [place]. Continue Namenda  10 mg daily, tolerating well.             Nephrology    Chronic kidney disease, stage 3 unspecified    Advised to limit sodium in diet.  Avoid OTC NSAID'S.          Relevant Orders    HGA1C (HEMOGLOBIN A1C WITH EST AVG GLUCOSE)    CBC    COMPREHENSIVE METABOLIC PNL, FASTING    LIPID PANEL    MAGNESIUM     THYROID  STIMULATING HORMONE (SENSITIVE  TSH)    VITAMIN D  25 TOTAL    VITAMIN B12    FOLATE (Completed)    SCAN DIFFERENTIAL (Completed)       Endocrine    IFG (impaired fasting glucose)    Managing with diet and exercise.         Relevant Orders    HGA1C (HEMOGLOBIN A1C WITH EST AVG GLUCOSE)    CBC    COMPREHENSIVE METABOLIC PNL, FASTING    LIPID PANEL    MAGNESIUM     THYROID  STIMULATING HORMONE (SENSITIVE TSH)    VITAMIN D  25 TOTAL    VITAMIN B12    FOLATE (Completed)    SCAN DIFFERENTIAL (Completed)    Vitamin D  deficiency, unspecified    Continue weekly vitamin D  50,000 IU         Relevant Orders    HGA1C (HEMOGLOBIN A1C WITH EST AVG GLUCOSE)    CBC    COMPREHENSIVE METABOLIC PNL, FASTING    LIPID PANEL    MAGNESIUM     THYROID  STIMULATING HORMONE (SENSITIVE TSH)    VITAMIN D  25 TOTAL    VITAMIN B12    FOLATE (Completed)    SCAN DIFFERENTIAL (Completed)    Folate deficiency anemia    Further treatment pending labs.          Relevant Orders    HGA1C (HEMOGLOBIN A1C WITH EST AVG GLUCOSE)    CBC    COMPREHENSIVE METABOLIC PNL, FASTING    LIPID PANEL    MAGNESIUM     THYROID  STIMULATING HORMONE (SENSITIVE TSH)    VITAMIN D  25 TOTAL    VITAMIN B12    FOLATE (Completed)    SCAN DIFFERENTIAL (Completed)       Urology    Benign prostatic hyperplasia, unspecified whether lower urinary tract symptoms present    Continue Tamsulosin  0.4mg  daily and Proscar  5mg  daily.         Relevant Orders    HGA1C (HEMOGLOBIN A1C WITH EST AVG GLUCOSE)    CBC    COMPREHENSIVE METABOLIC PNL, FASTING    LIPID PANEL    MAGNESIUM     THYROID  STIMULATING HORMONE (SENSITIVE TSH)    VITAMIN D  25 TOTAL  VITAMIN B12    FOLATE (Completed)    SCAN DIFFERENTIAL (Completed)       Other    Hypomagnesemia    Continue over-the-counter magnesium  250 mg daily         Relevant Orders    HGA1C (HEMOGLOBIN A1C WITH EST AVG GLUCOSE)    CBC    COMPREHENSIVE METABOLIC PNL, FASTING    LIPID PANEL    MAGNESIUM     THYROID  STIMULATING HORMONE (SENSITIVE TSH)    VITAMIN D  25 TOTAL    VITAMIN B12    FOLATE  (Completed)    SCAN DIFFERENTIAL (Completed)    Impaired functional mobility, balance, gait, and endurance    Discussed fall risk.  Patient is ambulatory without a cane.  Has been doing well.  Drives.  No concerns voiced today.         Relevant Orders    HGA1C (HEMOGLOBIN A1C WITH EST AVG GLUCOSE)    CBC    COMPREHENSIVE METABOLIC PNL, FASTING    LIPID PANEL    MAGNESIUM     THYROID  STIMULATING HORMONE (SENSITIVE TSH)    VITAMIN D  25 TOTAL    VITAMIN B12    FOLATE (Completed)    SCAN DIFFERENTIAL (Completed)    Long term current use of anticoagulant therapy    Continue Eliquis  5 mg BID.  Hx Afib.         Relevant Orders    HGA1C (HEMOGLOBIN A1C WITH EST AVG GLUCOSE)    CBC    COMPREHENSIVE METABOLIC PNL, FASTING    LIPID PANEL    MAGNESIUM     THYROID  STIMULATING HORMONE (SENSITIVE TSH)    VITAMIN D  25 TOTAL    VITAMIN B12    FOLATE (Completed)    SCAN DIFFERENTIAL (Completed)    Ataxia    Dr. Maisie Scotland, Neurologist, [place], [place]; requesting last office note.          Relevant Orders    HGA1C (HEMOGLOBIN A1C WITH EST AVG GLUCOSE)    CBC    COMPREHENSIVE METABOLIC PNL, FASTING    LIPID PANEL    MAGNESIUM     THYROID  STIMULATING HORMONE (SENSITIVE TSH)    VITAMIN D  25 TOTAL    VITAMIN B12    FOLATE (Completed)    SCAN DIFFERENTIAL (Completed)     Other Visit Diagnoses         Iron deficiency anemia        Relevant Orders    FERRITIN (Completed)    IRON TRANSFERRIN AND TIBC (Completed)          Tobacco cessation counseling not performed due to nonsmoker.     Labs drawn today.  Continue current medications  as prescribed.  Advised to follow up with specialists as scheduled.  Advised a low-fat/low sodium diet, advised 150 minutes of scheduled weekly physical activity as tolerated.  Advised to maintain a healthy weight.     Return in about 3 months (around 01/25/2024) for routine visit.    Valorie Gearing, NP-C 10/27/2023, 16:41

## 2023-10-25 NOTE — Assessment & Plan Note (Signed)
Continue Breo and albuterol PRN.   Does not recall last time he used albuterol

## 2023-10-25 NOTE — Nursing Note (Signed)
 Patient here for follow up with no new complaints

## 2023-10-25 NOTE — Assessment & Plan Note (Signed)
Reports he quit using CPAP in Sept 2023, reports he is sleeping better.  No longer following with Pulmonology.

## 2023-10-25 NOTE — Assessment & Plan Note (Signed)
Advised to limit sodium in diet. Avoid OTC NSAID'S.

## 2023-10-26 LAB — HGA1C (HEMOGLOBIN A1C WITH EST AVG GLUCOSE): HEMOGLOBIN A1C: 5.3 % (ref 4.0–6.0)

## 2023-10-27 ENCOUNTER — Encounter (RURAL_HEALTH_CENTER): Payer: Self-pay | Admitting: Family

## 2023-10-27 NOTE — Assessment & Plan Note (Signed)
 Continue Eliquis  5 mg BID.  Hx Afib.

## 2023-10-27 NOTE — Assessment & Plan Note (Signed)
 Dr. Maisie Scotland, Neurologist, [place], [place]; requesting last office note.

## 2023-10-27 NOTE — Assessment & Plan Note (Signed)
Continue over-the-counter magnesium 250 mg daily.

## 2023-10-27 NOTE — Assessment & Plan Note (Signed)
Discussed fall risk.  Patient is ambulatory without a cane.  Has been doing well.  Drives.  No concerns voiced today.

## 2023-10-27 NOTE — Assessment & Plan Note (Signed)
 Further treatment pending labs.

## 2023-10-27 NOTE — Assessment & Plan Note (Addendum)
 Continue Tamsulosin  0.4mg  daily and Proscar  5mg  daily.

## 2023-10-27 NOTE — Assessment & Plan Note (Signed)
 Managing with diet and exercise.

## 2023-10-27 NOTE — Assessment & Plan Note (Signed)
 Continue weekly vitamin D 50, 000 IU

## 2023-10-27 NOTE — Assessment & Plan Note (Signed)
 Managed by Neurology in [place], [place]. Continue Namenda  10 mg daily, tolerating well.

## 2023-12-20 ENCOUNTER — Other Ambulatory Visit (INDEPENDENT_AMBULATORY_CARE_PROVIDER_SITE_OTHER): Payer: Self-pay | Admitting: Physician Assistant

## 2023-12-20 MED ORDER — TAMSULOSIN 0.4 MG CAPSULE
0.4000 mg | ORAL_CAPSULE | Freq: Every evening | ORAL | 3 refills | Status: AC
Start: 2023-12-20 — End: ?

## 2023-12-20 MED ORDER — FINASTERIDE 5 MG TABLET
5.0000 mg | ORAL_TABLET | Freq: Every day | ORAL | 3 refills | Status: AC
Start: 2023-12-20 — End: ?

## 2024-01-23 ENCOUNTER — Ambulatory Visit (RURAL_HEALTH_CENTER): Payer: Self-pay | Admitting: Family

## 2024-02-05 ENCOUNTER — Ambulatory Visit (RURAL_HEALTH_CENTER): Payer: Self-pay | Attending: Family | Admitting: Family

## 2024-02-05 ENCOUNTER — Encounter (RURAL_HEALTH_CENTER): Payer: Self-pay | Admitting: Family

## 2024-02-05 ENCOUNTER — Other Ambulatory Visit: Payer: Self-pay

## 2024-02-05 ENCOUNTER — Ambulatory Visit: Attending: Family | Admitting: Family

## 2024-02-05 VITALS — BP 148/78 | HR 64 | Temp 98.2°F | Wt 170.0 lb

## 2024-02-05 DIAGNOSIS — I5032 Chronic diastolic (congestive) heart failure: Secondary | ICD-10-CM | POA: Insufficient documentation

## 2024-02-05 DIAGNOSIS — N183 Chronic kidney disease, stage 3 unspecified: Secondary | ICD-10-CM | POA: Insufficient documentation

## 2024-02-05 DIAGNOSIS — Z7901 Long term (current) use of anticoagulants: Secondary | ICD-10-CM | POA: Insufficient documentation

## 2024-02-05 DIAGNOSIS — R270 Ataxia, unspecified: Secondary | ICD-10-CM | POA: Insufficient documentation

## 2024-02-05 DIAGNOSIS — E559 Vitamin D deficiency, unspecified: Secondary | ICD-10-CM | POA: Insufficient documentation

## 2024-02-05 DIAGNOSIS — Z7409 Other reduced mobility: Secondary | ICD-10-CM | POA: Insufficient documentation

## 2024-02-05 DIAGNOSIS — D696 Thrombocytopenia, unspecified: Secondary | ICD-10-CM | POA: Insufficient documentation

## 2024-02-05 DIAGNOSIS — G4730 Sleep apnea, unspecified: Secondary | ICD-10-CM | POA: Insufficient documentation

## 2024-02-05 DIAGNOSIS — E782 Mixed hyperlipidemia: Secondary | ICD-10-CM | POA: Insufficient documentation

## 2024-02-05 DIAGNOSIS — I4891 Unspecified atrial fibrillation: Secondary | ICD-10-CM | POA: Insufficient documentation

## 2024-02-05 DIAGNOSIS — J449 Chronic obstructive pulmonary disease, unspecified: Secondary | ICD-10-CM | POA: Insufficient documentation

## 2024-02-05 DIAGNOSIS — Z79899 Other long term (current) drug therapy: Secondary | ICD-10-CM | POA: Insufficient documentation

## 2024-02-05 DIAGNOSIS — R7301 Impaired fasting glucose: Secondary | ICD-10-CM | POA: Insufficient documentation

## 2024-02-05 DIAGNOSIS — Z7951 Long term (current) use of inhaled steroids: Secondary | ICD-10-CM | POA: Insufficient documentation

## 2024-02-05 DIAGNOSIS — N400 Enlarged prostate without lower urinary tract symptoms: Secondary | ICD-10-CM | POA: Insufficient documentation

## 2024-02-05 NOTE — Assessment & Plan Note (Signed)
Advised to limit sodium in diet. Avoid OTC NSAID'S.

## 2024-02-05 NOTE — Assessment & Plan Note (Signed)
 Further treatment pending labs.

## 2024-02-05 NOTE — Assessment & Plan Note (Signed)
Continue Breo and albuterol PRN.   Does not recall last time he used albuterol

## 2024-02-05 NOTE — Assessment & Plan Note (Signed)
 Managing with diet and exercise.

## 2024-02-05 NOTE — Assessment & Plan Note (Signed)
 Continue weekly vitamin D 50, 000 IU

## 2024-02-05 NOTE — Nursing Note (Signed)
 Patient here for follow up with no new complaints

## 2024-02-05 NOTE — Assessment & Plan Note (Signed)
Reports he quit using CPAP in Sept 2023, reports he is sleeping better.  No longer following with Pulmonology.

## 2024-02-05 NOTE — Assessment & Plan Note (Signed)
 Rosuvastatin 20 mg daily has been discontinued due to decreased kidney function at time of hospitalization in August 2024.

## 2024-02-05 NOTE — Assessment & Plan Note (Signed)
 Patient is no longer taking Jardiance due to hx of fall and decreased kidney function.  Patient has not restarted.

## 2024-02-05 NOTE — Progress Notes (Unsigned)
 [place] [facility]  [facility]      Caige Almeda.  MRN: [id number]  DOB: 06-02-1940  Date of Service: 02/05/2024    CHIEF COMPLAINT  Chief Complaint   Patient presents with    Follow Up 3 Months       SUBJECTIVE  Napoleon Monacelli. is a 83 y.o. male who presents to clinic for chronic disease management.  Patient is accompanied by daughter.  Patient is currently under the care of cardiologist, Dr. Sheralyn;  Dr. Zulma, Neurologist; and [facility].  . No concerns voiced today.  Patient is accompanied by daughter.      Review of Systems:  Positive ROS discussed in HPI, otherwise all other systems negative.      Medications:   acetaminophen  (TYLENOL  ARTHRITIS PAIN) 650 mg Oral Tablet Sustained Release, Take 1 Tablet (650 mg total) by mouth Twice daily  albuterol  sulfate (PROVENTIL  OR VENTOLIN  OR PROAIR ) 90 mcg/actuation Inhalation oral inhaler, Take 2 Puffs by inhalation Every 6 hours as needed  BREO ELLIPTA  100-25 mcg/dose Inhalation Disk with Device, 1 Inhalation Daily  carvediloL  (COREG ) 25 mg Oral Tablet, Take 1 Tablet (25 mg total) by mouth Twice daily (Patient taking differently: Take 0.5 Tablets (12.5 mg total) by mouth Twice daily)  casanthranol/docusate sodium (PERI-COLACE ORAL), Take by mouth 1 am and 2 pm  cholecalciferol , vitamin D3, 25 mcg (1,000 unit) Oral Tablet, Take 2 Tablets (2,000 Units total) by mouth Daily  ELIQUIS  5 mg Oral Tablet, Take 1 Tablet (5 mg total) by mouth Twice daily  ferrous sulfate (FERATAB) 324 mg (65 mg iron) Oral Tablet, Delayed Release (E.C.), Take 1 Tablet (324 mg total) by mouth Twice a day before meals  finasteride  (PROSCAR ) 5 mg Oral Tablet, Take 1 Tablet (5 mg total) by mouth Daily  losartan  (COZAAR ) 100 mg Oral Tablet, TAKE ONE TABLET BY MOUTH ONCE EVERY DAY  magnesium  oxide (MAG-OX) 400 mg Oral Tablet, Take 1 Tablet (400 mg total) by mouth Daily  memantine  (NAMENDA ) 10 mg Oral Tablet, TAKE 1/2 TABLET BY MOUTH ONCE EVERY DAY  at bedtime FOR 7 DAYS THEN TAKE 1/2 TABLET TWICE DAILY FOR 7 DAYS THEN TAKE 1/2 TABLET EVERY MORNING AND TAKE ONE TABLET AT BEDTIME FOR 7 DAYS THEN TAKE ONE TABLET TWICE DAILY THEREAFTER  multivitamin (SUPER THERA VITE M) Oral Tablet, Take 1 Tablet by mouth  POTASSIUM-99 ORAL, Take 1 Tablet by mouth  rivastigmine (EXELON) 1.5 mg Oral Capsule, Take 2 Capsules (3 mg total) by mouth Twice daily with food  tamsulosin  (FLOMAX ) 0.4 mg Oral Capsule, Take 1 Capsule (0.4 mg total) by mouth Every evening after dinner  VITAMIN B-1 100 mg Oral Tablet, Take 1 Tablet (100 mg total) by mouth Daily    No facility-administered medications prior to visit.      Allergies:   Allergies   Allergen Reactions    Hydrocodone-Homatropine Itching         OBJECTIVE  BP (!) 150/72 (Site: Right Arm, Patient Position: Sitting, Cuff Size: Adult)   Pulse 64   Temp 36.8 C (98.2 F)   Wt 77.1 kg (170 lb)   SpO2 98%   BMI 24.05 kg/m       Physical Exam  Vitals reviewed.   Constitutional:       Appearance: Normal appearance.   Cardiovascular:      Rate and Rhythm: Normal rate. Rhythm irregular.      Pulses: Normal pulses.  Radial pulses are 2+ on the right side and 2+ on the left side.      Heart sounds: Normal heart sounds, S1 normal and S2 normal.   Pulmonary:      Effort: Pulmonary effort is normal.      Breath sounds: Normal breath sounds.   Abdominal:      General: Bowel sounds are normal.   Musculoskeletal:      Cervical back: Neck supple.      Right lower leg: No edema.      Left lower leg: No edema.   Skin:     General: Skin is warm.   Neurological:      General: No focal deficit present.      Mental Status: He is alert and oriented to person, place, and time. Mental status is at baseline.      Cranial Nerves: Cranial nerves 2-12 are intact.      Motor: Weakness present.      Coordination: Coordination abnormal.      Gait: Gait abnormal.   Psychiatric:         Speech: Speech normal.         Behavior: Behavior normal. Behavior is  cooperative.         Cognition and Memory: Cognition is impaired. Memory is impaired.         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) Mixed hyperlipidemia  Plan: FERRITIN, IRON TRANSFERRIN AND TIBC, FOLATE,         HGA1C (HEMOGLOBIN A1C WITH EST AVG GLUCOSE),         CBC, COMPREHENSIVE METABOLIC PNL, FASTING,         LIPID PANEL, MAGNESIUM , THYROID  STIMULATING         HORMONE (SENSITIVE TSH), VITAMIN D  25 TOTAL,         VITAMIN B12    (I48.91) Atrial fibrillation  Plan: FERRITIN, IRON TRANSFERRIN AND TIBC, FOLATE,         HGA1C (HEMOGLOBIN A1C WITH EST AVG GLUCOSE),         CBC, COMPREHENSIVE METABOLIC PNL, FASTING,         LIPID PANEL, MAGNESIUM , THYROID  STIMULATING         HORMONE (SENSITIVE TSH), VITAMIN D  25 TOTAL,         VITAMIN B12    (J44.9) COPD (chronic obstructive pulmonary disease)  Plan: FERRITIN, IRON TRANSFERRIN AND TIBC, FOLATE,         HGA1C (HEMOGLOBIN A1C WITH EST AVG GLUCOSE),         CBC, COMPREHENSIVE METABOLIC PNL, FASTING,         LIPID PANEL, MAGNESIUM , THYROID  STIMULATING         HORMONE (SENSITIVE TSH), VITAMIN D  25 TOTAL,         VITAMIN B12    (G47.30) Sleep apnea  Plan: FERRITIN, IRON TRANSFERRIN AND TIBC, FOLATE,         HGA1C (HEMOGLOBIN A1C WITH EST AVG GLUCOSE),         CBC,  COMPREHENSIVE METABOLIC PNL, FASTING,         LIPID PANEL, MAGNESIUM , THYROID  STIMULATING         HORMONE (SENSITIVE TSH), VITAMIN D  25 TOTAL,         VITAMIN B12    (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, stage 3 unspecified  Plan: FERRITIN, IRON TRANSFERRIN AND TIBC, FOLATE,         HGA1C (HEMOGLOBIN A1C WITH EST AVG GLUCOSE),         CBC, COMPREHENSIVE METABOLIC PNL,  FASTING,         LIPID PANEL, MAGNESIUM , THYROID  STIMULATING         HORMONE (SENSITIVE TSH), VITAMIN D  25 TOTAL,         VITAMIN B12    (R73.01) IFG (impaired fasting glucose)  Plan: FERRITIN, IRON TRANSFERRIN AND TIBC, FOLATE,         HGA1C (HEMOGLOBIN A1C WITH EST AVG GLUCOSE),         CBC, COMPREHENSIVE METABOLIC PNL, FASTING,         LIPID PANEL, MAGNESIUM , THYROID  STIMULATING         HORMONE (SENSITIVE TSH), VITAMIN D  25 TOTAL,         VITAMIN B12    (E55.9) Vitamin D  deficiency, unspecified  Plan: FERRITIN, IRON TRANSFERRIN AND TIBC, FOLATE,         HGA1C (HEMOGLOBIN A1C WITH EST AVG GLUCOSE),         CBC, COMPREHENSIVE METABOLIC PNL, FASTING,         LIPID PANEL, MAGNESIUM , THYROID  STIMULATING         HORMONE (SENSITIVE TSH), VITAMIN D  25 TOTAL,         VITAMIN B12    (D69.6) Thrombocytopenia, unspecified (CMS HCC)  Plan: FERRITIN, IRON TRANSFERRIN AND TIBC, FOLATE,         HGA1C (HEMOGLOBIN A1C WITH EST AVG GLUCOSE),         CBC, COMPREHENSIVE METABOLIC PNL, FASTING,         LIPID PANEL, MAGNESIUM , THYROID  STIMULATING         HORMONE (SENSITIVE TSH), VITAMIN D  25 TOTAL,         VITAMIN B12    (R27.0) Ataxia  Plan: FERRITIN, IRON TRANSFERRIN AND TIBC, FOLATE,         HGA1C (HEMOGLOBIN A1C WITH EST AVG GLUCOSE),         CBC, COMPREHENSIVE METABOLIC PNL, FASTING,         LIPID PANEL, MAGNESIUM , THYROID  STIMULATING         HORMONE (SENSITIVE TSH), VITAMIN D  25 TOTAL,         VITAMIN B12    (E83.42) Hypomagnesemia  Plan: FERRITIN, IRON TRANSFERRIN AND TIBC, FOLATE,         HGA1C (HEMOGLOBIN A1C WITH EST AVG GLUCOSE),         CBC, COMPREHENSIVE METABOLIC PNL, FASTING,         LIPID PANEL, MAGNESIUM , THYROID  STIMULATING         HORMONE (SENSITIVE TSH), VITAMIN D  25 TOTAL,         VITAMIN B12    (Z74.09) Impaired functional mobility, balance, gait, and endurance  Plan: FERRITIN, IRON TRANSFERRIN AND TIBC, FOLATE,         HGA1C (HEMOGLOBIN A1C WITH EST AVG GLUCOSE),         CBC, COMPREHENSIVE METABOLIC PNL, FASTING,          LIPID PANEL, MAGNESIUM , THYROID  STIMULATING  HORMONE (SENSITIVE TSH), VITAMIN D  25 TOTAL,         VITAMIN B12    (Z79.01) Long term current use of anticoagulant therapy  Plan: FERRITIN, IRON TRANSFERRIN AND TIBC, FOLATE,         HGA1C (HEMOGLOBIN A1C WITH EST AVG GLUCOSE),         CBC, COMPREHENSIVE METABOLIC PNL, FASTING,         LIPID PANEL, MAGNESIUM , THYROID  STIMULATING         HORMONE (SENSITIVE TSH), VITAMIN D  25 TOTAL,         VITAMIN B12    (N40.0) Benign prostatic hyperplasia, unspecified whether lower urinary tract symptoms present         Problem List Items Addressed This Visit          Cardiovascular System    Atrial fibrillation    Relevant Orders    FERRITIN    IRON TRANSFERRIN AND TIBC    FOLATE    HGA1C (HEMOGLOBIN A1C WITH EST AVG GLUCOSE)    CBC    COMPREHENSIVE METABOLIC PNL, FASTING    LIPID PANEL    MAGNESIUM     THYROID  STIMULATING HORMONE (SENSITIVE TSH)    VITAMIN D  25 TOTAL    VITAMIN B12    Mixed hyperlipidemia    Rosuvastatin  20 mg daily has been discontinued due to decreased kidney function at time of hospitalization in August 2024.          Relevant Orders    FERRITIN    IRON TRANSFERRIN AND TIBC    FOLATE    HGA1C (HEMOGLOBIN A1C WITH EST AVG GLUCOSE)    CBC    COMPREHENSIVE METABOLIC PNL, FASTING    LIPID PANEL    MAGNESIUM     THYROID  STIMULATING HORMONE (SENSITIVE TSH)    VITAMIN D  25 TOTAL    VITAMIN B12    Chronic diastolic (congestive) heart failure - Primary    Patient is no longer taking Jardiance due to hx of fall and decreased kidney function.  Patient has not restarted.          Relevant Orders    FERRITIN    IRON TRANSFERRIN AND TIBC    FOLATE    HGA1C (HEMOGLOBIN A1C WITH EST AVG GLUCOSE)    CBC    COMPREHENSIVE METABOLIC PNL, FASTING    LIPID PANEL    MAGNESIUM     THYROID  STIMULATING HORMONE (SENSITIVE TSH)    VITAMIN D  25 TOTAL    VITAMIN B12       Respiratory    Sleep apnea    Reports he quit using CPAP in Sept 2023, reports he is sleeping better.  No longer  following with Pulmonology.          Relevant Orders    FERRITIN    IRON TRANSFERRIN AND TIBC    FOLATE    HGA1C (HEMOGLOBIN A1C WITH EST AVG GLUCOSE)    CBC    COMPREHENSIVE METABOLIC PNL, FASTING    LIPID PANEL    MAGNESIUM     THYROID  STIMULATING HORMONE (SENSITIVE TSH)    VITAMIN D  25 TOTAL    VITAMIN B12    COPD (chronic obstructive pulmonary disease)    Continue Breo and albuterol  PRN.   Does not recall last time he used albuterol          Relevant Orders    FERRITIN    IRON TRANSFERRIN AND TIBC    FOLATE    HGA1C (HEMOGLOBIN A1C WITH EST AVG GLUCOSE)  CBC    COMPREHENSIVE METABOLIC PNL, FASTING    LIPID PANEL    MAGNESIUM     THYROID  STIMULATING HORMONE (SENSITIVE TSH)    VITAMIN D  25 TOTAL    VITAMIN B12       Nephrology    Chronic kidney disease, stage 3 unspecified    Advised to limit sodium in diet.  Avoid OTC NSAID'S.          Relevant Medications    cholecalciferol , vitamin D3, 25 mcg (1,000 unit) Oral Tablet    Other Relevant Orders    FERRITIN    IRON TRANSFERRIN AND TIBC    FOLATE    HGA1C (HEMOGLOBIN A1C WITH EST AVG GLUCOSE)    CBC    COMPREHENSIVE METABOLIC PNL, FASTING    LIPID PANEL    MAGNESIUM     THYROID  STIMULATING HORMONE (SENSITIVE TSH)    VITAMIN D  25 TOTAL    VITAMIN B12       Endocrine    IFG (impaired fasting glucose)    Managing with diet and exercise.         Relevant Orders    FERRITIN    IRON TRANSFERRIN AND TIBC    FOLATE    HGA1C (HEMOGLOBIN A1C WITH EST AVG GLUCOSE)    CBC    COMPREHENSIVE METABOLIC PNL, FASTING    LIPID PANEL    MAGNESIUM     THYROID  STIMULATING HORMONE (SENSITIVE TSH)    VITAMIN D  25 TOTAL    VITAMIN B12    Vitamin D  deficiency, unspecified    Continue weekly vitamin D  50,000 IU         Relevant Orders    FERRITIN    IRON TRANSFERRIN AND TIBC    FOLATE    HGA1C (HEMOGLOBIN A1C WITH EST AVG GLUCOSE)    CBC    COMPREHENSIVE METABOLIC PNL, FASTING    LIPID PANEL    MAGNESIUM     THYROID  STIMULATING HORMONE (SENSITIVE TSH)    VITAMIN D  25 TOTAL    VITAMIN B12        Hematologic/Lymphatic    Thrombocytopenia, unspecified (CMS HCC)    Further treatment pending labs.         Relevant Orders    FERRITIN    IRON TRANSFERRIN AND TIBC    FOLATE    HGA1C (HEMOGLOBIN A1C WITH EST AVG GLUCOSE)    CBC    COMPREHENSIVE METABOLIC PNL, FASTING    LIPID PANEL    MAGNESIUM     THYROID  STIMULATING HORMONE (SENSITIVE TSH)    VITAMIN D  25 TOTAL    VITAMIN B12       Urology    Benign prostatic hyperplasia, unspecified whether lower urinary tract symptoms present       Other    Hypomagnesemia    Relevant Orders    FERRITIN    IRON TRANSFERRIN AND TIBC    FOLATE    HGA1C (HEMOGLOBIN A1C WITH EST AVG GLUCOSE)    CBC    COMPREHENSIVE METABOLIC PNL, FASTING    LIPID PANEL    MAGNESIUM     THYROID  STIMULATING HORMONE (SENSITIVE TSH)    VITAMIN D  25 TOTAL    VITAMIN B12    Impaired functional mobility, balance, gait, and endurance    Relevant Orders    FERRITIN    IRON TRANSFERRIN AND TIBC    FOLATE    HGA1C (HEMOGLOBIN A1C WITH EST AVG GLUCOSE)    CBC    COMPREHENSIVE METABOLIC PNL, FASTING  LIPID PANEL    MAGNESIUM     THYROID  STIMULATING HORMONE (SENSITIVE TSH)    VITAMIN D  25 TOTAL    VITAMIN B12    Long term current use of anticoagulant therapy    Relevant Orders    FERRITIN    IRON TRANSFERRIN AND TIBC    FOLATE    HGA1C (HEMOGLOBIN A1C WITH EST AVG GLUCOSE)    CBC    COMPREHENSIVE METABOLIC PNL, FASTING    LIPID PANEL    MAGNESIUM     THYROID  STIMULATING HORMONE (SENSITIVE TSH)    VITAMIN D  25 TOTAL    VITAMIN B12    Ataxia    Relevant Orders    FERRITIN    IRON TRANSFERRIN AND TIBC    FOLATE    HGA1C (HEMOGLOBIN A1C WITH EST AVG GLUCOSE)    CBC    COMPREHENSIVE METABOLIC PNL, FASTING    LIPID PANEL    MAGNESIUM     THYROID  STIMULATING HORMONE (SENSITIVE TSH)    VITAMIN D  25 TOTAL    VITAMIN B12     Tobacco cessation counseling not performed due to nonsmoker.     Labs drawn today.  Continue current medications  as prescribed.  Advised to follow up with specialists as scheduled.  Advised a low-fat/low  sodium diet, advised 150 minutes of scheduled weekly physical activity as tolerated.  Advised to maintain a healthy weight.     No follow-ups on file.    Donny LITTIE Clonts, NP-C 02/05/2024, 14:49      Physical Exam  Vitals reviewed.   Constitutional:       Appearance: Normal appearance.   Cardiovascular:      Rate and Rhythm: Normal rate. Rhythm irregular.      Pulses: Normal pulses.           Radial pulses are 2+ on the right side and 2+ on the left side.      Heart sounds: Normal heart sounds, S1 normal and S2 normal.   Pulmonary:      Effort: Pulmonary effort is normal.      Breath sounds: Normal breath sounds.   Abdominal:      General: Bowel sounds are normal.   Musculoskeletal:      Cervical back: Neck supple.      Right lower leg: No edema.      Left lower leg: No edema.   Skin:     General: Skin is warm.   Neurological:      General: No focal deficit present.      Mental Status: He is alert and oriented to person, place, and time. Mental status is at baseline.      Cranial Nerves: Cranial nerves 2-12 are intact.      Motor: Weakness present.      Coordination: Coordination abnormal.      Gait: Gait abnormal.   Psychiatric:         Speech: Speech normal.         Behavior: Behavior normal. Behavior is cooperative.         Cognition and Memory: Cognition is impaired. Memory is impaired.

## 2024-02-06 LAB — IRON TRANSFERRIN AND TIBC
IRON (TRANSFERRIN) SATURATION: 22 % (ref 20–50)
IRON: 65 ug/dL (ref 50–212)
TOTAL IRON BINDING CAPACITY: 297 ug/dL (ref 250–450)
TRANSFERRIN: 212 mg/dL (ref 203–362)
UIBC: 232 ug/dL (ref 130–375)

## 2024-02-06 LAB — CBC
HCT: 41.2 % (ref 36.7–47.1)
HGB: 14 g/dL (ref 12.5–16.3)
MCH: 33 pg (ref 23.8–33.4)
MCHC: 34 g/dL (ref 32.5–36.3)
MCV: 97.2 fL — ABNORMAL HIGH (ref 73.0–96.2)
MPV: 8.1 fL (ref 7.4–11.4)
PLATELETS: 107 x10ˆ3/uL — ABNORMAL LOW (ref 140–440)
RBC: 4.23 x10ˆ6/uL (ref 4.06–5.63)
RDW: 15 % (ref 12.1–16.2)
WBC: 3.3 x10ˆ3/uL — ABNORMAL LOW (ref 3.6–10.2)

## 2024-02-06 LAB — COMPREHENSIVE METABOLIC PNL, FASTING
ALBUMIN/GLOBULIN RATIO: 1.5 — ABNORMAL HIGH (ref 0.8–1.4)
ALBUMIN: 4.8 g/dL (ref 3.5–5.7)
ALKALINE PHOSPHATASE: 98 U/L (ref 34–104)
ALT (SGPT): 22 U/L (ref 7–52)
ANION GAP: 4 mmol/L (ref 4–13)
AST (SGOT): 31 U/L (ref 13–39)
BILIRUBIN TOTAL: 1.1 mg/dL — ABNORMAL HIGH (ref 0.3–1.0)
BUN/CREA RATIO: 43 — ABNORMAL HIGH (ref 6–22)
BUN: 28 mg/dL — ABNORMAL HIGH (ref 7–25)
CALCIUM, CORRECTED: 9.2 mg/dL (ref 8.9–10.8)
CALCIUM: 9.8 mg/dL (ref 8.6–10.3)
CHLORIDE: 107 mmol/L (ref 98–107)
CO2 TOTAL: 31 mmol/L (ref 21–31)
CREATININE: 0.65 mg/dL (ref 0.60–1.30)
ESTIMATED GFR: 94 mL/min/1.73mˆ2 (ref >59)
GLOBULIN: 3.3 (ref 2.0–3.5)
GLUCOSE: 124 mg/dL — ABNORMAL HIGH (ref 74–109)
OSMOLALITY, CALCULATED: 290 mosm/kg (ref 270–290)
POTASSIUM: 4.1 mmol/L (ref 3.5–5.1)
PROTEIN TOTAL: 8.1 g/dL (ref 6.4–8.9)
SODIUM: 142 mmol/L (ref 136–145)

## 2024-02-06 LAB — LIPID PANEL
CHOL/HDL RATIO: 2.3
CHOLESTEROL: 130 mg/dL (ref <200)
HDL CHOL: 57 mg/dL (ref >=40)
LDL CALC: 61 mg/dL (ref 0–100)
TRIGLYCERIDES: 61 mg/dL (ref <=150)
VLDL CALC: 12 mg/dL (ref 0–50)

## 2024-02-06 LAB — HGA1C (HEMOGLOBIN A1C WITH EST AVG GLUCOSE): HEMOGLOBIN A1C: 5.3 % (ref 4.0–6.0)

## 2024-02-06 LAB — FERRITIN: FERRITIN: 185 ng/mL (ref 24–336)

## 2024-02-06 LAB — FOLATE: FOLATE: 39.9 ng/mL — ABNORMAL HIGH (ref 5.9–24.8)

## 2024-02-06 LAB — MAGNESIUM: MAGNESIUM: 2.4 mg/dL (ref 1.9–2.7)

## 2024-02-06 LAB — VITAMIN D 25 TOTAL: VITAMIN D 25, TOTAL: 64.59 ng/mL (ref 30.00–100.00)

## 2024-02-06 LAB — VITAMIN B12: VITAMIN B 12: 787 pg/mL (ref 180–914)

## 2024-02-06 LAB — THYROID STIMULATING HORMONE (SENSITIVE TSH): TSH: 2.092 u[IU]/mL (ref 0.450–5.330)

## 2024-02-06 NOTE — Assessment & Plan Note (Signed)
Managed by Dr. Javed, Cardiologist.

## 2024-02-08 ENCOUNTER — Encounter (RURAL_HEALTH_CENTER): Payer: Self-pay | Admitting: Family

## 2024-02-29 ENCOUNTER — Other Ambulatory Visit (RURAL_HEALTH_CENTER): Payer: Self-pay | Admitting: Family

## 2024-02-29 MED ORDER — LOSARTAN 100 MG TABLET
100.0000 mg | ORAL_TABLET | Freq: Every day | ORAL | 1 refills | Status: AC
Start: 2024-02-29 — End: ?

## 2024-04-03 ENCOUNTER — Encounter (HOSPITAL_COMMUNITY): Payer: Self-pay

## 2024-04-12 ENCOUNTER — Encounter (INDEPENDENT_AMBULATORY_CARE_PROVIDER_SITE_OTHER): Payer: Self-pay

## 2024-04-12 ENCOUNTER — Ambulatory Visit (INDEPENDENT_AMBULATORY_CARE_PROVIDER_SITE_OTHER): Payer: Self-pay | Admitting: OTOLARYNGOLOGY

## 2024-05-21 ENCOUNTER — Other Ambulatory Visit: Payer: Self-pay

## 2024-05-21 ENCOUNTER — Ambulatory Visit (RURAL_HEALTH_CENTER)

## 2024-05-21 ENCOUNTER — Ambulatory Visit

## 2024-05-21 DIAGNOSIS — D492 Neoplasm of unspecified behavior of bone, soft tissue, and skin: Secondary | ICD-10-CM | POA: Insufficient documentation

## 2024-05-21 LAB — BASIC METABOLIC PANEL
ANION GAP: 6 mmol/L (ref 4–13)
BUN/CREA RATIO: 31 — ABNORMAL HIGH (ref 6–22)
BUN: 22 mg/dL (ref 7–25)
CALCIUM: 9.5 mg/dL (ref 8.6–10.3)
CHLORIDE: 109 mmol/L — ABNORMAL HIGH (ref 98–107)
CO2 TOTAL: 26 mmol/L (ref 21–31)
CREATININE: 0.7 mg/dL (ref 0.60–1.30)
ESTIMATED GFR: 91 mL/min/1.73mˆ2 (ref >59)
GLUCOSE: 99 mg/dL (ref 74–109)
OSMOLALITY, CALCULATED: 285 mosm/kg (ref 270–290)
POTASSIUM: 4.5 mmol/L (ref 3.5–5.1)
SODIUM: 141 mmol/L (ref 136–145)

## 2024-06-16 ENCOUNTER — Other Ambulatory Visit (RURAL_HEALTH_CENTER): Payer: Self-pay | Admitting: Family

## 2024-06-16 DIAGNOSIS — C439 Malignant melanoma of skin, unspecified: Secondary | ICD-10-CM | POA: Insufficient documentation

## 2024-07-03 ENCOUNTER — Ambulatory Visit (RURAL_HEALTH_CENTER): Payer: Self-pay | Admitting: Family

## 2024-07-08 ENCOUNTER — Ambulatory Visit (RURAL_HEALTH_CENTER): Admitting: Family

## 2024-08-05 ENCOUNTER — Ambulatory Visit (RURAL_HEALTH_CENTER): Payer: Self-pay | Admitting: Family
